# Patient Record
Sex: Female | Born: 1935 | Race: White | Hispanic: No | Marital: Single
Health system: Southern US, Community
[De-identification: ages and names within clinical notes are randomized; demographics above are authoritative.]

## PROBLEM LIST (undated history)

## (undated) DIAGNOSIS — R011 Cardiac murmur, unspecified: Secondary | ICD-10-CM

## (undated) DIAGNOSIS — E079 Disorder of thyroid, unspecified: Secondary | ICD-10-CM

## (undated) DIAGNOSIS — I509 Heart failure, unspecified: Secondary | ICD-10-CM

## (undated) DIAGNOSIS — I10 Essential (primary) hypertension: Secondary | ICD-10-CM

---

## 2009-01-03 ENCOUNTER — Ambulatory Visit: Payer: Self-pay | Admitting: Family Medicine

## 2011-01-16 ENCOUNTER — Ambulatory Visit: Payer: Self-pay | Admitting: Ophthalmology

## 2011-01-29 ENCOUNTER — Ambulatory Visit: Payer: Self-pay | Admitting: Ophthalmology

## 2011-12-04 ENCOUNTER — Inpatient Hospital Stay: Payer: Self-pay | Admitting: Specialist

## 2011-12-04 LAB — CBC
HCT: 46.5 % (ref 35.0–47.0)
MCH: 27.8 pg (ref 26.0–34.0)
Platelet: 386 10*3/uL (ref 150–440)
RDW: 15.6 % — ABNORMAL HIGH (ref 11.5–14.5)

## 2011-12-04 LAB — URINALYSIS, COMPLETE
Bilirubin,UR: NEGATIVE
Blood: NEGATIVE
Glucose,UR: NEGATIVE mg/dL (ref 0–75)
Ketone: NEGATIVE
Leukocyte Esterase: NEGATIVE
Ph: 6 (ref 4.5–8.0)
RBC,UR: <1 /HPF (ref 0–5)
Squamous Epithelial: NONE SEEN

## 2011-12-04 LAB — COMPREHENSIVE METABOLIC PANEL
Anion Gap: 15 (ref 7–16)
Bilirubin,Total: 0.4 mg/dL (ref 0.2–1.0)
Chloride: 102 mmol/L (ref 98–107)
Co2: 23 mmol/L (ref 21–32)
Creatinine: 1.02 mg/dL (ref 0.60–1.30)
EGFR (African American): >60
EGFR (Non-African Amer.): 54 — ABNORMAL LOW
Potassium: 3.8 mmol/L (ref 3.5–5.1)
SGOT(AST): 27 U/L (ref 15–37)
SGPT (ALT): 20 U/L
Sodium: 140 mmol/L (ref 136–145)
Total Protein: 7.4 g/dL (ref 6.4–8.2)

## 2011-12-04 LAB — PROTIME-INR: INR: 0.9

## 2011-12-04 LAB — CK TOTAL AND CKMB (NOT AT ARMC): CK, Total: 40 U/L (ref 21–215)

## 2011-12-04 LAB — TROPONIN I: Troponin-I: <0.02 ng/mL

## 2011-12-04 LAB — TSH: Thyroid Stimulating Horm: 1.61 u[IU]/mL

## 2011-12-04 LAB — HEMOGLOBIN A1C: Hemoglobin A1C: 7.6 % — ABNORMAL HIGH (ref 4.2–6.3)

## 2011-12-06 LAB — HEMATOCRIT: HCT: 35.4 % (ref 35.0–47.0)

## 2012-05-24 ENCOUNTER — Emergency Department (HOSPITAL_COMMUNITY): Payer: Medicare Other

## 2012-05-24 ENCOUNTER — Encounter (HOSPITAL_COMMUNITY): Payer: Self-pay | Admitting: *Deleted

## 2012-05-24 ENCOUNTER — Inpatient Hospital Stay (HOSPITAL_COMMUNITY)
Admission: EM | Admit: 2012-05-24 | Discharge: 2012-05-26 | DRG: 065 | Disposition: A | Discharge status: Home / Self Care | Payer: Medicare Other | Attending: Internal Medicine | Admitting: Internal Medicine

## 2012-05-24 DIAGNOSIS — D72829 Elevated white blood cell count, unspecified: Secondary | ICD-10-CM | POA: Diagnosis present

## 2012-05-24 DIAGNOSIS — G8190 Hemiplegia, unspecified affecting unspecified side: Secondary | ICD-10-CM | POA: Diagnosis present

## 2012-05-24 DIAGNOSIS — D696 Thrombocytopenia, unspecified: Secondary | ICD-10-CM | POA: Diagnosis present

## 2012-05-24 DIAGNOSIS — R4701 Aphasia: Secondary | ICD-10-CM | POA: Diagnosis present

## 2012-05-24 DIAGNOSIS — E871 Hypo-osmolality and hyponatremia: Secondary | ICD-10-CM | POA: Diagnosis present

## 2012-05-24 DIAGNOSIS — G459 Transient cerebral ischemic attack, unspecified: Secondary | ICD-10-CM | POA: Diagnosis present

## 2012-05-24 DIAGNOSIS — E039 Hypothyroidism, unspecified: Secondary | ICD-10-CM | POA: Diagnosis present

## 2012-05-24 DIAGNOSIS — Z79899 Other long term (current) drug therapy: Secondary | ICD-10-CM

## 2012-05-24 DIAGNOSIS — I471 Supraventricular tachycardia, unspecified: Secondary | ICD-10-CM | POA: Diagnosis present

## 2012-05-24 DIAGNOSIS — D649 Anemia, unspecified: Secondary | ICD-10-CM | POA: Diagnosis present

## 2012-05-24 DIAGNOSIS — I6330 Cerebral infarction due to thrombosis of unspecified cerebral artery: Principal | ICD-10-CM | POA: Diagnosis present

## 2012-05-24 DIAGNOSIS — E119 Type 2 diabetes mellitus without complications: Secondary | ICD-10-CM | POA: Diagnosis present

## 2012-05-24 DIAGNOSIS — R29810 Facial weakness: Secondary | ICD-10-CM | POA: Diagnosis present

## 2012-05-24 DIAGNOSIS — I10 Essential (primary) hypertension: Secondary | ICD-10-CM | POA: Diagnosis present

## 2012-05-24 DIAGNOSIS — I639 Cerebral infarction, unspecified: Secondary | ICD-10-CM | POA: Diagnosis present

## 2012-05-24 DIAGNOSIS — H9190 Unspecified hearing loss, unspecified ear: Secondary | ICD-10-CM | POA: Diagnosis present

## 2012-05-24 HISTORY — DX: Disorder of thyroid, unspecified: E07.9

## 2012-05-24 HISTORY — DX: Heart failure, unspecified: I50.9

## 2012-05-24 HISTORY — DX: Cardiac murmur, unspecified: R01.1

## 2012-05-24 HISTORY — DX: Essential (primary) hypertension: I10

## 2012-05-24 LAB — CBC
Hemoglobin: 13 g/dL (ref 12.0–15.0)
MCH: 25.1 pg — ABNORMAL LOW (ref 26.0–34.0)
MCV: 76.6 fL — ABNORMAL LOW (ref 78.0–100.0)
RBC: 5.17 MIL/uL — ABNORMAL HIGH (ref 3.87–5.11)
WBC: 17.4 10*3/uL — ABNORMAL HIGH (ref 4.0–10.5)

## 2012-05-24 LAB — DIFFERENTIAL
Eosinophils Absolute: 0.5 10*3/uL (ref 0.0–0.7)
Eosinophils Relative: 3 % (ref 0–5)
Lymphocytes Relative: 14 % (ref 12–46)
Lymphs Abs: 2.4 10*3/uL (ref 0.7–4.0)
Monocytes Relative: 11 % (ref 3–12)
Neutrophils Relative %: 71 % (ref 43–77)

## 2012-05-24 LAB — COMPREHENSIVE METABOLIC PANEL
ALT: 16 U/L (ref 0–35)
Alkaline Phosphatase: 151 U/L — ABNORMAL HIGH (ref 39–117)
BUN: 24 mg/dL — ABNORMAL HIGH (ref 6–23)
CO2: 22 mEq/L (ref 19–32)
GFR calc Af Amer: 52 mL/min — ABNORMAL LOW (ref >90)
GFR calc non Af Amer: 45 mL/min — ABNORMAL LOW (ref >90)
Glucose, Bld: 192 mg/dL — ABNORMAL HIGH (ref 70–99)
Potassium: 4 mEq/L (ref 3.5–5.1)
Sodium: 132 mEq/L — ABNORMAL LOW (ref 135–145)
Total Protein: 6.7 g/dL (ref 6.0–8.3)

## 2012-05-24 LAB — TROPONIN I: Troponin I: <0.3 ng/mL (ref <0.30)

## 2012-05-24 LAB — PROTIME-INR
INR: 1 (ref 0.00–1.49)
Prothrombin Time: 13.1 seconds (ref 11.6–15.2)

## 2012-05-24 LAB — APTT: aPTT: 33 seconds (ref 24–37)

## 2012-05-24 NOTE — ED Notes (Signed)
Patient walked the restroom this evening around 18:40 and sister notice that patient had difficulty speaking at 18:45. Other family members reported patient had right sided weakness during event.

## 2012-05-24 NOTE — Consult Note (Signed)
Reason for Consult: Concern for stroke Referring Physician: Donnetta Hutching  CC: Right-sided weakness and aphasia  History is obtained from: Patient, son  HPI: Tina Munoz is an 78 y.o. female who was in her normal state of health, when out to dinner and got background 6 or 6:15, then around 6:40 was noted to have significant trouble with her right arm and difficulty speaking. EMS was called and brought her into the emergency room as a code stroke. On arrival, her son and daughter-in-law are able to state that she seemed significantly better than when they had seen her earlier. However, she did have continued right arm weakness and some difficulty speaking.  Also of note, she has had recent loose stools over the course of the day.  Of note she was just recently diagnosed with diabetes given that she had random blood glucose is greater than 200   ROS: An 11 point ROS was performed and is negative except as noted in the HPI.  Past Medical History  Diagnosis Date  . Hypertension   . Diabetes mellitus   . Thyroid disease     Family History: Unremarkable  Social History: Tob: Denies  Exam: Current vital signs: BP 163/87  Pulse 100  Temp 97.8 F (36.6 C) (Oral)  Resp 18  Ht 5\' 8"  (1.727 m)  Wt 70.761 kg (156 lb)  BMI 23.72 kg/m2  SpO2 99% Vital signs in last 24 hours: Temp:  [97.8 F (36.6 C)] 97.8 F (36.6 C) (09/21 1956) Pulse Rate:  [100] 100  (09/21 1956) Resp:  [18] 18  (09/21 1956) BP: (163)/(87) 163/87 mmHg (09/21 1956) SpO2:  [99 %] 99 % (09/21 2014) Weight:  [70.761 kg (156 lb)] 70.761 kg (156 lb) (09/21 1956)  General: Awake, alert, very hard of hearing CV: Regular rate and rhythm Mental Status: Patient is able to give correct age, gives month as May. She does have mild aphasia, but is able to correctly answer most questions She is able to follow commands correctly including showing 2 fingers on a specific hand Cranial Nerves: II: Visual Fields are full to  hand movement , however she has significant difficulty seeing in certain areas. (Baseline) right than left pupil, both are reactive.  Discs are difficult to visualize. III,IV, VI: Eyes are disconjugate (baseline) V,VII: Facial sensation and movement are symmetric.  VIII: Very hard of hearing X: Uvula elevates symmetrically XI: Shoulder shrug is symmetric. XII: tongue is midline without atrophy or fasciculations.  Motor: Tone is normal. Bulk is normal. 5/5 strength was present in her right leg, left leg, left arm. She has 4/5 strength in her right arm with some mild drift but does not touch the bed Sensory: Sensation is symmetric to light touch and pinprick in the arms and legs. Deep Tendon Reflexes: 2+ and symmetric in the biceps and patellae.  Plantars: Toes are downgoing bilaterally.  Cerebellar: FNF and HKS are intact bilaterally Gait: Did not assess due to the acute nature for evaluation and worsening of symptoms once sitting up.  I have reviewed labs in epic and the results pertinent to this consultation are: Elevated white count CBC remarkable for elevated blood glucose   I have reviewed the images obtained: CT head-multiple age indeterminate infarcts  Impression: 78 year old female with sudden onset right hemiparesis and aphasia which is rapidly improving. She still has significant symptoms however with a NIH of 3, previous strokes of indeterminate age, her age plus diabetes, and rapid improvement I do not feel that TPA  is indicated in her case.  Recommendations: 1. HgbA1c, fasting lipid panel 2. MRI, MRA  of the brain without contrast 3. PT consult, OT consult, Speech consult(she will need nursing swallow eval prior to any by mouth medications or food.) 4. Echocardiogram 5. Carotid dopplers 6. Prophylactic therapy-Antiplatelet med: Aspirin - dose 300 if PR, 325 this oral 7. Risk factor modification 8. Telemetry monitoring 9. Frequent neuro checks 10. Permissive  hypertension  [name], MD [facility] [phone]

## 2012-05-24 NOTE — ED Notes (Signed)
Patient with reported speech difficulty and right sided weakness per family. Patient sister reported that patient walked to the restroom around 18:40 and she notice at 18:45 that patient had difficulty speaking. Other family members reported patient with right sided weakness.

## 2012-05-25 ENCOUNTER — Inpatient Hospital Stay (HOSPITAL_COMMUNITY): Payer: Medicare Other

## 2012-05-25 ENCOUNTER — Encounter (HOSPITAL_COMMUNITY): Payer: Self-pay | Admitting: *Deleted

## 2012-05-25 DIAGNOSIS — I10 Essential (primary) hypertension: Secondary | ICD-10-CM | POA: Diagnosis present

## 2012-05-25 DIAGNOSIS — H9190 Unspecified hearing loss, unspecified ear: Secondary | ICD-10-CM | POA: Diagnosis present

## 2012-05-25 DIAGNOSIS — D72829 Elevated white blood cell count, unspecified: Secondary | ICD-10-CM | POA: Diagnosis present

## 2012-05-25 DIAGNOSIS — G459 Transient cerebral ischemic attack, unspecified: Secondary | ICD-10-CM | POA: Diagnosis present

## 2012-05-25 DIAGNOSIS — E119 Type 2 diabetes mellitus without complications: Secondary | ICD-10-CM | POA: Diagnosis present

## 2012-05-25 DIAGNOSIS — E039 Hypothyroidism, unspecified: Secondary | ICD-10-CM | POA: Diagnosis present

## 2012-05-25 LAB — CBC
HCT: 37.7 % (ref 36.0–46.0)
HCT: 36.6 % (ref 36.0–46.0)
Hemoglobin: 12.6 g/dL (ref 12.0–15.0)
Hemoglobin: 11.9 g/dL — ABNORMAL LOW (ref 12.0–15.0)
MCH: 25.3 pg — ABNORMAL LOW (ref 26.0–34.0)
MCH: 24.8 pg — ABNORMAL LOW (ref 26.0–34.0)
MCHC: 33.4 g/dL (ref 30.0–36.0)
MCHC: 32.5 g/dL (ref 30.0–36.0)
MCV: 75.6 fL — ABNORMAL LOW (ref 78.0–100.0)
MCV: 76.4 fL — ABNORMAL LOW (ref 78.0–100.0)
Platelets: 697 K/uL — ABNORMAL HIGH (ref 150–400)
Platelets: 642 K/uL — ABNORMAL HIGH (ref 150–400)
RBC: 4.99 MIL/uL (ref 3.87–5.11)
RBC: 4.79 MIL/uL (ref 3.87–5.11)
RDW: 18.8 % — ABNORMAL HIGH (ref 11.5–15.5)
RDW: 19 % — ABNORMAL HIGH (ref 11.5–15.5)
WBC: 16.8 K/uL — ABNORMAL HIGH (ref 4.0–10.5)
WBC: 15.3 K/uL — ABNORMAL HIGH (ref 4.0–10.5)

## 2012-05-25 LAB — GLUCOSE, CAPILLARY
Glucose-Capillary: 137 mg/dL — ABNORMAL HIGH (ref 70–99)
Glucose-Capillary: 163 mg/dL — ABNORMAL HIGH (ref 70–99)
Glucose-Capillary: 114 mg/dL — ABNORMAL HIGH (ref 70–99)

## 2012-05-25 LAB — URINALYSIS, ROUTINE W REFLEX MICROSCOPIC
Bilirubin Urine: NEGATIVE
Hgb urine dipstick: NEGATIVE
Ketones, ur: NEGATIVE mg/dL
Nitrite: NEGATIVE
Protein, ur: NEGATIVE mg/dL
Specific Gravity, Urine: 1.016 (ref 1.005–1.030)
Urobilinogen, UA: 0.2 mg/dL (ref 0.0–1.0)

## 2012-05-25 LAB — LIPID PANEL
Cholesterol: 200 mg/dL (ref 0–200)
HDL: 72 mg/dL (ref >39)
Total CHOL/HDL Ratio: 2.8 RATIO
Triglycerides: 45 mg/dL (ref <150)

## 2012-05-25 LAB — CREATININE, SERUM
Creatinine, Ser: 0.75 mg/dL (ref 0.50–1.10)
GFR calc Af Amer: 83 mL/min — ABNORMAL LOW
GFR calc non Af Amer: 71 mL/min — ABNORMAL LOW

## 2012-05-25 LAB — HEMOGLOBIN A1C
Hgb A1c MFr Bld: 7.9 % — ABNORMAL HIGH (ref <5.7)
Mean Plasma Glucose: 180 mg/dL — ABNORMAL HIGH (ref <117)

## 2012-05-25 LAB — URINE MICROSCOPIC-ADD ON

## 2012-05-25 MED ORDER — VITAMIN C 500 MG PO TABS
1000.0000 mg | ORAL_TABLET | Freq: Every day | ORAL | Status: DC
  Administered 2012-05-25 – 2012-05-26 (×2): 1000 mg via ORAL
  Filled 2012-05-25 (×2): qty 2

## 2012-05-25 MED ORDER — LEVOTHYROXINE SODIUM 112 MCG PO TABS
112.0000 ug | ORAL_TABLET | Freq: Every day | ORAL | Status: DC
  Administered 2012-05-25 – 2012-05-26 (×2): 112 ug via ORAL
  Filled 2012-05-25 (×4): qty 1

## 2012-05-25 MED ORDER — VITAMIN D3 25 MCG (1000 UNIT) PO TABS
5000.0000 [IU] | ORAL_TABLET | Freq: Every morning | ORAL | Status: DC
  Administered 2012-05-25 – 2012-05-26 (×2): 5000 [IU] via ORAL
  Filled 2012-05-25 (×2): qty 5

## 2012-05-25 MED ORDER — SENNOSIDES-DOCUSATE SODIUM 8.6-50 MG PO TABS: 1.0000 | ORAL_TABLET | Freq: Every evening | ORAL | Status: DC | PRN

## 2012-05-25 MED ORDER — HEPARIN SODIUM (PORCINE) 5000 UNIT/ML IJ SOLN
5000.0000 [IU] | Freq: Three times a day (TID) | INTRAMUSCULAR | Status: DC
  Administered 2012-05-25 – 2012-05-26 (×5): 5000 [IU] via SUBCUTANEOUS
  Filled 2012-05-25 (×8): qty 1

## 2012-05-25 MED ORDER — NITROFURANTOIN MACROCRYSTAL 100 MG PO CAPS
100.0000 mg | ORAL_CAPSULE | Freq: Every morning | ORAL | Status: DC
  Administered 2012-05-25 – 2012-05-26 (×2): 100 mg via ORAL
  Filled 2012-05-25 (×2): qty 1

## 2012-05-25 MED ORDER — INSULIN ASPART 100 UNIT/ML ~~LOC~~ SOLN
0.0000 [IU] | Freq: Three times a day (TID) | SUBCUTANEOUS | Status: DC
  Administered 2012-05-25 – 2012-05-26 (×4): 2 [IU] via SUBCUTANEOUS

## 2012-05-25 MED ORDER — ASPIRIN 325 MG PO TABS
325.0000 mg | ORAL_TABLET | Freq: Every day | ORAL | Status: DC
  Administered 2012-05-25 – 2012-05-26 (×2): 325 mg via ORAL
  Filled 2012-05-25 (×2): qty 1

## 2012-05-25 MED ORDER — SODIUM CHLORIDE 0.9 % IJ SOLN: 3.0000 mL | INTRAMUSCULAR | Status: DC | PRN

## 2012-05-25 MED ORDER — SODIUM CHLORIDE 0.9 % IV SOLN: 250.0000 mL | INTRAVENOUS | Status: DC | PRN

## 2012-05-25 MED ORDER — DILTIAZEM HCL ER 120 MG PO CP24
120.0000 mg | ORAL_CAPSULE | Freq: Every morning | ORAL | Status: DC
  Administered 2012-05-25 – 2012-05-26 (×2): 120 mg via ORAL
  Filled 2012-05-25 (×2): qty 1

## 2012-05-25 MED ORDER — SODIUM CHLORIDE 0.9 % IJ SOLN
3.0000 mL | Freq: Two times a day (BID) | INTRAMUSCULAR | Status: DC
  Administered 2012-05-25 – 2012-05-26 (×4): 3 mL via INTRAVENOUS

## 2012-05-25 MED ORDER — ADULT MULTIVITAMIN W/MINERALS CH
1.0000 | ORAL_TABLET | Freq: Every day | ORAL | Status: DC
  Administered 2012-05-25 – 2012-05-26 (×2): 1 via ORAL
  Filled 2012-05-25 (×2): qty 1

## 2012-05-25 MED ORDER — ASPIRIN 300 MG RE SUPP
300.0000 mg | Freq: Every day | RECTAL | Status: DC
  Filled 2012-05-25 (×2): qty 1

## 2012-05-25 NOTE — ED Provider Notes (Signed)
History     CSN: [id number]  Arrival date & time 05/24/12  1940   First MD Initiated Contact with Patient 05/24/12 2001      Chief Complaint  Patient presents with  . Cerebrovascular Accident    (Consider location/radiation/quality/duration/timing/severity/associated sxs/prior treatment) HPI... slurred speech and right-sided weakness approximately 1840 tonight.   Symptoms have improved slightly. No prodromal illnesses. Level V caveat for urgent need for intervention  Past Medical History  Diagnosis Date  . Hypertension   . Diabetes mellitus   . Thyroid disease     No past surgical history on file.  No family history on file.  History  Substance Use Topics  . Smoking status: Not on file  . Smokeless tobacco: Not on file  . Alcohol Use: No    OB History    Grav Para Term Preterm Abortions TAB SAB Ect Mult Living                  Review of Systems  Unable to perform ROS: Other    Allergies  Review of patient's allergies indicates no known allergies.  Home Medications   Current Outpatient Rx  Name Route Sig Dispense Refill  . VITAMIN C 1000 MG PO TABS Oral Take 1,000 mg by mouth daily.    Marland Kitchen VITAMIN D-3 5000 UNITS PO TABS Oral Take 1 tablet by mouth every morning.    Marland Kitchen DILTIAZEM HCL ER 240 MG PO CP24 Oral Take 240 mg by mouth every morning.    Marland Kitchen LEVOTHYROXINE SODIUM 112 MCG PO TABS Oral Take 112 mcg by mouth every morning.    Marland Kitchen LISINOPRIL 10 MG PO TABS Oral Take 10 mg by mouth every morning.    . LUTEIN-ZEAXANTHIN PO Oral Take 1 tablet by mouth daily.    . ADULT MULTIVITAMIN W/MINERALS CH Oral Take 1 tablet by mouth daily.    Marland Kitchen NITROFURANTOIN MACROCRYSTAL 100 MG PO CAPS Oral Take 100 mg by mouth every morning.      BP 155/88  Pulse 84  Temp 97.8 F (36.6 C) (Oral)  Resp 13  Ht 5\' 8"  (1.727 m)  Wt 156 lb (70.761 kg)  BMI 23.72 kg/m2  SpO2 98%  Physical Exam  Nursing note and vitals reviewed. Constitutional: She is oriented to person, place, and  time. She appears well-developed and well-nourished.       Hard of hearing  HENT:  Head: Normocephalic and atraumatic.  Eyes: Conjunctivae normal and EOM are normal. Pupils are equal, round, and reactive to light.  Neck: Normal range of motion. Neck supple.  Cardiovascular: Normal rate, regular rhythm and normal heart sounds.   Pulmonary/Chest: Effort normal and breath sounds normal.  Abdominal: Soft. Bowel sounds are normal.  Musculoskeletal: Normal range of motion.  Neurological: She is alert and oriented to person, place, and time.  Skin: Skin is warm and dry.  Psychiatric: She has a normal mood and affect.    ED Course  Procedures (including critical care time)  Labs Reviewed  CBC - Abnormal; Notable for the following:    WBC 17.4 (*)     RBC 5.17 (*)     MCV 76.6 (*)     MCH 25.1 (*)     RDW 18.9 (*)     Platelets 704 (*)     All other components within normal limits  DIFFERENTIAL - Abnormal; Notable for the following:    Neutro Abs 12.3 (*)     Monocytes Absolute 1.9 (*)  Basophils Absolute 0.2 (*)     All other components within normal limits  COMPREHENSIVE METABOLIC PANEL - Abnormal; Notable for the following:    Sodium 132 (*)     Glucose, Bld 192 (*)     BUN 24 (*)     Albumin 3.2 (*)     Alkaline Phosphatase 151 (*)     Total Bilirubin 0.2 (*)     GFR calc non Af Amer 45 (*)     GFR calc Af Amer 52 (*)     All other components within normal limits  PROTIME-INR  APTT  TROPONIN I  URINALYSIS, ROUTINE W REFLEX MICROSCOPIC   Ct Head Wo Contrast  05/24/2012  *RADIOLOGY REPORT*  Clinical Data: Code stroke.  Transient right arm weakness and possible slurred speech.  CT HEAD WITHOUT CONTRAST  Technique:  Contiguous axial images were obtained from the base of the skull through the vertex without contrast.  Comparison: None.  Findings: No intracranial hemorrhage.  Remote infarct with encephalomalacia left parietal lobe and right parietal - occipital region.   Moderate small vessel disease type changes.  Right frontal lobe small infarct, age indeterminate.  No CT evidence of large acute infarct.  No intracranial mass lesion detected on this unenhanced exam.  Global atrophy without hydrocephalus.  Partially empty sella.  Mastoid air cells, middle ear cavities and visualized sinuses are clear.  IMPRESSION: No intracranial hemorrhage or CT evidence of large acute infarct.  Remote infarcts and small vessel disease type changes as noted above.  Critical Value/emergent results were called by telephone at the time of interpretation on 05/24/2012 at 7:58 p.m. to Dr. Weldon Inches, who verbally acknowledged these results.   Original Report Authenticated By: Fuller Canada, M.D.      No diagnosis found.    MDM  Daughter reports the patient appears to be improving. CT scan of head shows no acute findings. Discussed with hospitalist. Admit.        Donnetta Hutching, MD 05/30/12 [phone]

## 2012-05-25 NOTE — Progress Notes (Signed)
[facility] PROGRESS NOTE  Tina Munoz WGN:[id number] DOB: 27-Jan-1918 DOA: 05/24/2012 PCP: Lorie Phenix, MD  Assessment/Plan: Principal Problem:  *TIA (transient ischemic attack) Active Problems:  HTN (hypertension)  Hearing loss  Leukocytosis  1. Possible CVA with right hemiparesis and aphasia versus TIA: Aphasia has resolved. Patient has minimal right fingers dexterity deficit. Followup on MRI brain and rest of the stroke workup. Neurology consultation appreciated in following. Continue aspirin 325 mg daily for secondary prophylaxis. Consider statins. PT, OT and ST evaluation. 2. Hypertension: Allow permissive hypertension. Continue to hold lisinopril but will start diltiazem XL at reduced dose of 120 mg daily given NSSVT and risk for rebound tachycardia. 3. Type 2 diabetes mellitus: Diet controlled. Hemoglobin A1c 7.9. Reasonable inpatient control. Check CBGs and place on sliding scale insulin. May need low dose oral hypoglycemics as outpatient. 4. Hypothyroidism: Continue Synthroid. TSH 5.833. May consider increasing Synthroid as outpatient. 5. Hyponatremia: Follow BMP in a.m. 6. Leukocytosis: No clinical focus of infection.? Stress response. Improving. Monitor. 7. Mild anemia and thrombocytosis. Follow CBC in a.m.  Code Status: DO NOT RESUSCITATE. Family Communication: Discussed with patient's daughter Ms. Christella Scheuermann, son Mr. Katrine Radich and daughter-in-law at the bedside, updated care and answered questions. Disposition Plan: Home   Brief narrative: 78 y.o. female who was in her normal state of health, when out to dinner and got background 6 or 6:15, then around 6:40 p.m. on 05/24/12 was noted to have significant trouble with her right arm and difficulty speaking. EMS was called and brought her into the emergency room as a code stroke. On arrival, her son and daughter-in-law are able to state that she seemed significantly better than when they had seen her earlier.  However, she did have continued right arm weakness and some difficulty speaking. She was not a TPA candidate secondary to rapid improvement and advanced age.   Consultants:  Neurology.  Procedures:  None  Antibiotics:  None  HPI/Subjective: Patient denies complaints and wishes to go home. Per family, speech is back to baseline. She still has difficulty in her right upper extremity and was not able to hold the spoon to feed herself  Objective: Filed Vitals:   05/25/12 0620 05/25/12 0815 05/25/12 0953 05/25/12 1159  BP: 146/89 163/84 169/83 170/97  Pulse: 92 86 80 77  Temp: 98 F (36.7 C) 97.9 F (36.6 C) 97.8 F (36.6 C) 97.9 F (36.6 C)  TempSrc: Oral     Resp: 18 20 20 20   Height:      Weight:      SpO2: 100% 98% 99% 98%   No intake or output data in the 24 hours ending 05/25/12 1358 Filed Weights   05/24/12 1956 05/25/12 0309  Weight: 70.761 kg (156 lb) 63.957 kg (141 lb)    Exam:   General exam: Moderately built and nourished elderly female patient, sitting comfortably on the chair and in no obvious distress. Hard of hearing.  Respiratory system: Clear to auscultation. No increased work of breathing.  Cardiovascular system: S1 and S2 heard, regular rate and rhythm. No JVD, murmurs or gallops or pedal edema. Telemetry shows sinus rhythm in the 70s to 80s and occasional runs of nonsustained SVT.  Gastrointestinal system: Nondistended, soft and nontender. Normal bowel sounds heard.  Central nervous system: Alert and oriented. Hard of hearing. No cranial nerve deficits.  Extremities: Grade 5 x 5 power in all limbs.   Data Reviewed: Basic Metabolic Panel:  Lab 05/25/12 7846 05/24/12 1947  NA -- 132*  K -- 4.0  CL -- 98  CO2 -- 22  GLUCOSE -- 192*  BUN -- 24*  CREATININE 0.75 1.04  CALCIUM -- 9.6  MG -- --  PHOS -- --   Liver Function Tests:  Lab 05/24/12 1947  AST 19  ALT 16  ALKPHOS 151*  BILITOT 0.2*  PROT 6.7  ALBUMIN 3.2*   No results  found for this basename: LIPASE:5,AMYLASE:5 in the last 168 hours No results found for this basename: AMMONIA:5 in the last 168 hours CBC:  Lab 05/25/12 0540 05/25/12 0059 05/24/12 1947  WBC 15.3* 16.8* 17.4*  NEUTROABS -- -- 12.3*  HGB 11.9* 12.6 13.0  HCT 36.6 37.7 39.6  MCV 76.4* 75.6* 76.6*  PLT 642* 697* 704*   Cardiac Enzymes:  Lab 05/24/12 1947  CKTOTAL --  CKMB --  CKMBINDEX --  TROPONINI <0.30   BNP (last 3 results) No results found for this basename: PROBNP:3 in the last 8760 hours CBG:  Lab 05/25/12 1217 05/25/12 0641 05/25/12 0321  GLUCAP 164* 163* 137*    No results found for this or any previous visit (from the past 240 hour(s)).   Studies: Ct Head Wo Contrast  05/24/2012  *RADIOLOGY REPORT*  Clinical Data: Code stroke.  Transient right arm weakness and possible slurred speech.  CT HEAD WITHOUT CONTRAST  Technique:  Contiguous axial images were obtained from the base of the skull through the vertex without contrast.  Comparison: None.  Findings: No intracranial hemorrhage.  Remote infarct with encephalomalacia left parietal lobe and right parietal - occipital region.  Moderate small vessel disease type changes.  Right frontal lobe small infarct, age indeterminate.  No CT evidence of large acute infarct.  No intracranial mass lesion detected on this unenhanced exam.  Global atrophy without hydrocephalus.  Partially empty sella.  Mastoid air cells, middle ear cavities and visualized sinuses are clear.  IMPRESSION: No intracranial hemorrhage or CT evidence of large acute infarct.  Remote infarcts and small vessel disease type changes as noted above.  Critical Value/emergent results were called by telephone at the time of interpretation on 05/24/2012 at 7:58 p.m. to Dr. Weldon Inches, who verbally acknowledged these results.   Original Report Authenticated By: Fuller Canada, M.D.     Scheduled Meds:    . aspirin  300 mg Rectal Daily   Or  . aspirin  325 mg Oral Daily    . cholecalciferol  5,000 Units Oral q morning - 10a  . heparin  5,000 Units Subcutaneous Q8H  . insulin aspart  0-9 Units Subcutaneous TID WC  . levothyroxine  112 mcg Oral Q breakfast  . multivitamin with minerals  1 tablet Oral Daily  . nitrofurantoin  100 mg Oral q morning - 10a  . sodium chloride  3 mL Intravenous Q12H  . vitamin C  1,000 mg Oral Daily     [facility]  [facility] Pager [phone].. If 8PM-8AM, please contact night-coverage at [email or web address], password [email or web address] 05/25/2012, 1:58 PM  LOS: 1 day

## 2012-05-25 NOTE — Plan of Care (Signed)
Problem: Phase I Progression Outcomes Goal: Strict NPO til swallow screen done Outcome: Completed/Met Date Met:  05/25/12 Patient NPO until passing swallowing study. Goal: Antithrombotic given by end of Day 2 Outcome: Completed/Met Date Met:  05/25/12 Patient receiving heparin injection for vte precautions. Goal: Pain controlled with appropriate interventions Outcome: Progressing Patient denying pain at this time. Goal: Voiding-avoid urinary catheter unless indicated Outcome: Completed/Met Date Met:  05/25/12 Patient able to verbalize when needing to defecate or urinate.

## 2012-05-25 NOTE — Progress Notes (Signed)
VASCULAR LAB PRELIMINARY  PRELIMINARY  PRELIMINARY  PRELIMINARY  Carotid Dopplers completed.    Preliminary report:  There is no ICA stenosis.  Vertebral artery flow is antegrade.  Tina Munoz, 05/25/2012, 4:45 PM

## 2012-05-25 NOTE — H&P (Signed)
[facility] History and Physical  Cheri Ayotte ZOX:[id number] DOB: 1918/02/28 DOA: 05/24/2012  Referring physician: ED PCP: Lorie Phenix, MD   Chief Complaint: TIA  HPI: Tina Munoz is a 78 y.o. female who was feeling well until 6:40 PM this afternoon when she was noted to have slurred speech, R sided facial droop with drooling, RUE weakness, by her daughter in law.  EMS was called and patient was brought to ED as code stroke, on arrival to ED patient noted to have significantly improved symptoms by daughter in law and son, patient ruled out for TPA based on age criteria and improving symptoms.  Thankfully her rapid improvement continued and by the time Hospitalist service is consulted to admit the patient she is essentially back to baseline according to her daughter.  Review of Systems: The patient has severe deafness (baseline) dysconjugate gaze (baseline), poor peripheral vision (baseline), no dysuria, no fever, chills, notes no RUE weakness.  Past Medical History  Diagnosis Date  . Hypertension   . Diabetes mellitus   . Thyroid disease    No past surgical history on file. Social History:  does not have a smoking history on file. She does not have any smokeless tobacco history on file. She reports that she does not drink alcohol or use illicit drugs.  No Known Allergies  No family history on file.  Prior to Admission medications   Medication Sig Start Date End Date Taking? Authorizing Provider  Ascorbic Acid (VITAMIN C) 1000 MG tablet Take 1,000 mg by mouth daily.   Yes Historical Provider, MD  Cholecalciferol (VITAMIN D-3) 5000 UNITS TABS Take 1 tablet by mouth every morning.   Yes Historical Provider, MD  diltiazem (DILACOR XR) 240 MG 24 hr capsule Take 240 mg by mouth every morning.   Yes Historical Provider, MD  levothyroxine (SYNTHROID, LEVOTHROID) 112 MCG tablet Take 112 mcg by mouth every morning.   Yes Historical Provider, MD  lisinopril (PRINIVIL,ZESTRIL) 10 MG  tablet Take 10 mg by mouth every morning.   Yes Historical Provider, MD  LUTEIN-ZEAXANTHIN PO Take 1 tablet by mouth daily.   Yes Historical Provider, MD  Multiple Vitamin (MULTIVITAMIN WITH MINERALS) TABS Take 1 tablet by mouth daily.   Yes Historical Provider, MD  nitrofurantoin (MACRODANTIN) 100 MG capsule Take 100 mg by mouth every morning.   Yes Historical Provider, MD   Physical Exam: Filed Vitals:   05/24/12 2315 05/24/12 2330 05/24/12 2345 05/25/12 0000  BP:    155/88  Pulse: 82 86 84 84  Temp:      TempSrc:      Resp: 10 15 11 13   Height:      Weight:      SpO2: 98% 97% 97% 98%     General:  NAD resting comfortably in hospital bed  Eyes: PEERLA, EOMI, patient has dysconjugate gaze which is baseline, visual fields demonstrate difficulty with peripheral vision but no obvious heminopsia  ENT: Patient has severe hearing loss which is baseline according to her and her daughter  Neck: Supple, w/o JVD, w/o carotid brewit  Cardiovascular: RRR, there is a SEM 3/6 best heard at the R 2nd intercostal space  Respiratory: CTA B  Abdomen: soft, nt, nd, bs+  Skin: no edema, no rash  Musculoskeletal: 5/5 strength BUE, BLE, MAE  Neurologic: Difficulty with peripheral vision but no obvious heminopsia, dysconjugate gaze which is baseline is noted, CN 2-12 otherwise grossly intact, toung protrusion with slight deviation to the R side, mentation appears intact, smile is  symmetric, strength 5/5 in all extremities, coordination is slightly abnormal to depth perception but probably baseline considering the dysconjugate gaze  Labs on Admission:  Basic Metabolic Panel:  Lab 05/24/12 1610  NA 132*  K 4.0  CL 98  CO2 22  GLUCOSE 192*  BUN 24*  CREATININE 1.04  CALCIUM 9.6  MG --  PHOS --   Liver Function Tests:  Lab 05/24/12 1947  AST 19  ALT 16  ALKPHOS 151*  BILITOT 0.2*  PROT 6.7  ALBUMIN 3.2*   No results found for this basename: LIPASE:5,AMYLASE:5 in the last 168  hours No results found for this basename: AMMONIA:5 in the last 168 hours CBC:  Lab 05/24/12 1947  WBC 17.4*  NEUTROABS 12.3*  HGB 13.0  HCT 39.6  MCV 76.6*  PLT 704*   Cardiac Enzymes:  Lab 05/24/12 1947  CKTOTAL --  CKMB --  CKMBINDEX --  TROPONINI <0.30    BNP (last 3 results) No results found for this basename: PROBNP:3 in the last 8760 hours CBG: No results found for this basename: GLUCAP:5 in the last 168 hours  Radiological Exams on Admission: Ct Head Wo Contrast  05/24/2012  *RADIOLOGY REPORT*  Clinical Data: Code stroke.  Transient right arm weakness and possible slurred speech.  CT HEAD WITHOUT CONTRAST  Technique:  Contiguous axial images were obtained from the base of the skull through the vertex without contrast.  Comparison: None.  Findings: No intracranial hemorrhage.  Remote infarct with encephalomalacia left parietal lobe and right parietal - occipital region.  Moderate small vessel disease type changes.  Right frontal lobe small infarct, age indeterminate.  No CT evidence of large acute infarct.  No intracranial mass lesion detected on this unenhanced exam.  Global atrophy without hydrocephalus.  Partially empty sella.  Mastoid air cells, middle ear cavities and visualized sinuses are clear.  IMPRESSION: No intracranial hemorrhage or CT evidence of large acute infarct.  Remote infarcts and small vessel disease type changes as noted above.  Critical Value/emergent results were called by telephone at the time of interpretation on 05/24/2012 at 7:58 p.m. to Dr. Weldon Inches, who verbally acknowledged these results.   Original Report Authenticated By: Fuller Canada, M.D.     EKG: Independently reviewed.  Assessment/Plan Principal Problem:  *TIA (transient ischemic attack) Active Problems:  HTN (hypertension)  Hearing loss  Leukocytosis   1. TIA - will admit patient, carotid dopplers ordered, 2d echo ordered, MRI/MRA ordered, Tele monitor ordered, not really able  to appreciate a residual deficit at this time other than possibly mild R sided toung deviation. 2. HTN - allow permissive HTN due to TIA hold home HTN meds 3. Hearing loss - chronic and baseline 4. Leukocytosis and thrombocytosis - probably reactive from #1, patient has no signs or symptoms of infection, will monitor with AM labs. 5. Hypothyroidism - ordering TSH to check up on this.  Code Status: Full Code for now Family Communication: Spoke with daughter  Disposition Plan: Admit to inpatient  Time spent: 70 min  Mykhia Danish M. [facility] Pager [phone]  If 7PM-7AM, please contact night-coverage [email or web address] Password [facility] 05/25/2012, 12:57 AM

## 2012-05-25 NOTE — Progress Notes (Signed)
History: Tina Munoz is an 78 y.o. female who was in her normal state of health, when out to dinner and got background 6 or 6:15, then around 6:40 was noted to have significant trouble with her right arm and difficulty speaking. EMS was called and brought her into the emergency room as a code stroke. On arrival, her son and daughter-in-law are able to state that she seemed significantly better than when they had seen her earlier. However, she did have continued right arm weakness and some difficulty speaking.  LSN: 6:00 pm 05/24/12 tPA Given: no,NIH of 3, previous strokes of indeterminate age, her age plus diabetes, and rapid improvement  did not feel that TPA was indicated in her case.  Subjective: obtained by Dr. Pearlean Brownie: Doing well. No c/o.  Objective: BP 169/83  Pulse 80  Temp 97.8 F (36.6 C) (Oral)  Resp 20  Ht 5\' 8"  (1.727 m)  Wt 63.957 kg (141 lb)  BMI 21.44 kg/m2  SpO2 99%  CBGs  Basename 05/25/12 0641 05/25/12 0321  GLUCAP 163* 137*    Diet: cho mod  Activity:up   DVT Prophylaxis: heparin   Medications: Scheduled:   . aspirin  300 mg Rectal Daily   Or  . aspirin  325 mg Oral Daily  . cholecalciferol  5,000 Units Oral q morning - 10a  . heparin  5,000 Units Subcutaneous Q8H  . insulin aspart  0-9 Units Subcutaneous TID WC  . levothyroxine  112 mcg Oral Q breakfast  . multivitamin with minerals  1 tablet Oral Daily  . nitrofurantoin  100 mg Oral q morning - 10a  . sodium chloride  3 mL Intravenous Q12H  . vitamin C  1,000 mg Oral Daily   Neurologic Exam: performed by Dr. Pearlean Brownie. Mental Status: Alert, oriented, thought content appropriate.  Speech fluent without evidence of aphasia. Able to follow 3 step commands without difficulty. Cranial Nerves: II- Visual fields grossly intact. III/IV/VI-Extraocular movements intact.  Pupils reactive bilaterally. V/VII-Smile symmetric VIII-hearing grossly intact IX/X-normal gag XI-bilateral shoulder shrug XII-midline  tongue extension Motor: 5/5 bilaterally with normal tone and bulk.diminished fine finger movements on right. Orbits left over right upper extremity Sensory: Light touch intact throughout, bilaterally Deep Tendon Reflexes: 2+ and symmetric throughout Plantars: Downgoing bilaterally Cerebellar: Impaired fine finger movements on the right.  Lab Results: Basic Metabolic Panel:  Lab 05/25/12 4696 05/24/12 1947  NA -- 132*  K -- 4.0  CL -- 98  CO2 -- 22  GLUCOSE -- 192*  BUN -- 24*  CREATININE 0.75 1.04  CALCIUM -- 9.6  MG -- --  PHOS -- --   Liver Function Tests:  Lab 05/24/12 1947  AST 19  ALT 16  ALKPHOS 151*  BILITOT 0.2*  PROT 6.7  ALBUMIN 3.2*   CBC:  Lab 05/25/12 0540 05/25/12 0059 05/24/12 1947  WBC 15.3* 16.8* --  NEUTROABS -- -- 12.3*  HGB 11.9* 12.6 --  HCT 36.6 37.7 --  MCV 76.4* 75.6* --  PLT 642* 697* --   CBG:  Lab 05/25/12 0641 05/25/12 0321  GLUCAP 163* 137*   Fasting Lipid Panel:  Lab 05/25/12 0540  CHOL 200  HDL 72  LDLCALC 119*  TRIG 45  CHOLHDL 2.8  LDLDIRECT --   Coagulation:  Lab 05/24/12 1947  LABPROT 13.1  INR 1.00    Lab 05/25/12 0012  COLORURINE YELLOW  LABSPEC 1.016  PHURINE 6.5  GLUCOSEU NEGATIVE  HGBUR NEGATIVE  BILIRUBINUR NEGATIVE  KETONESUR NEGATIVE  PROTEINUR NEGATIVE  UROBILINOGEN 0.2  NITRITE NEGATIVE  LEUKOCYTESUR SMALL*   Misc. Labs  Study Results:  05/24/2012    CT HEAD WITHOUT CONTRAST  Technique:  Contiguous axial images were obtained from the base of the skull through the vertex without contrast.  Comparison: None.  Findings: No intracranial hemorrhage.  Remote infarct with encephalomalacia left parietal lobe and right parietal - occipital region.  Moderate small vessel disease type changes.  Right frontal lobe small infarct, age indeterminate.  No CT evidence of large acute infarct.  No intracranial mass lesion detected on this unenhanced exam.  Global atrophy without hydrocephalus.  Partially empty  sella.  Mastoid air cells, middle ear cavities and visualized sinuses are clear.  IMPRESSION: No intracranial hemorrhage or CT evidence of large acute infarct.  Remote infarcts and small vessel disease type changes as noted above. : Fuller Canada, M.D.    Therapies:pending   Assessment: 78 year old female with sudden onset right hemiparesis and aphasia which has nearly resolved; minimal right had fine finger deficits.     LDL 119; not at goal <70 A1C pending  Plan: 1. HgbA1c 2. MRI, MRA of the brain without contrast  3. PT consult, OT consult, Speech consult(she will need nursing swallow eval prior to any by mouth medications or food.)  4. Echocardiogram  5. Carotid dopplers  6. Prophylactic therapy-Antiplatelet med: Aspirin - dose 300 if PR, 325 this oral  7. Risk factor modification  8. Telemetry monitoring  9. Frequent neuro checks  10. Permissive hypertension   LOS: 1 day   Marya Fossa PA-C [facility] [phone] 05/25/2012  11:03 AM

## 2012-05-25 NOTE — Progress Notes (Signed)
  Echocardiogram 2D Echocardiogram has been performed.  Tina Munoz 05/25/2012, 11:26 AM

## 2012-05-25 NOTE — Plan of Care (Signed)
Problem: Phase I Progression Outcomes Goal: Pt admitted to Stroke Unit Outcome: Completed/Met Date Met:  05/25/12 Patient admitted to stroke unit on 05/24/12.

## 2012-05-25 NOTE — Progress Notes (Signed)
PT Cancellation Note  Treatment cancelled today due to medical issues with patient which prohibited therapy.  Chart reviewed noting order to begin PT evaluation on 9/23.  If entered in error please correct.  Will check back 9/22 pm and proceed or hold as indicated in documentation.  Thank you.  Narda Amber [facility] 05/25/2012, 8:34 AM

## 2012-05-26 DIAGNOSIS — E039 Hypothyroidism, unspecified: Secondary | ICD-10-CM

## 2012-05-26 DIAGNOSIS — I6350 Cerebral infarction due to unspecified occlusion or stenosis of unspecified cerebral artery: Secondary | ICD-10-CM

## 2012-05-26 DIAGNOSIS — I10 Essential (primary) hypertension: Secondary | ICD-10-CM

## 2012-05-26 DIAGNOSIS — E119 Type 2 diabetes mellitus without complications: Secondary | ICD-10-CM

## 2012-05-26 LAB — GLUCOSE, CAPILLARY
Glucose-Capillary: 162 mg/dL — ABNORMAL HIGH (ref 70–99)
Glucose-Capillary: 119 mg/dL — ABNORMAL HIGH (ref 70–99)

## 2012-05-26 LAB — CBC
Hemoglobin: 12 g/dL (ref 12.0–15.0)
MCHC: 33 g/dL (ref 30.0–36.0)
RDW: 19.2 % — ABNORMAL HIGH (ref 11.5–15.5)
WBC: 15.2 10*3/uL — ABNORMAL HIGH (ref 4.0–10.5)

## 2012-05-26 LAB — BASIC METABOLIC PANEL
Chloride: 103 mEq/L (ref 96–112)
GFR calc Af Amer: 83 mL/min — ABNORMAL LOW (ref >90)
GFR calc non Af Amer: 72 mL/min — ABNORMAL LOW (ref >90)
Potassium: 3.8 mEq/L (ref 3.5–5.1)
Sodium: 136 mEq/L (ref 135–145)

## 2012-05-26 MED ORDER — BOOST PLUS PO LIQD
237.0000 mL | Freq: Two times a day (BID) | ORAL | Status: DC
Start: 2012-05-26 — End: 2012-05-26
  Filled 2012-05-26 (×3): qty 237

## 2012-05-26 MED ORDER — GLIMEPIRIDE 1 MG PO TABS: 0.5000 mg | ORAL_TABLET | Freq: Every day | ORAL | Status: AC

## 2012-05-26 MED ORDER — SIMVASTATIN 10 MG PO TABS: 10.0000 mg | ORAL_TABLET | Freq: Every day | ORAL | Status: AC

## 2012-05-26 MED ORDER — ASPIRIN EC 325 MG PO TBEC: 325.0000 mg | DELAYED_RELEASE_TABLET | Freq: Every day | ORAL | Status: AC

## 2012-05-26 NOTE — Care Management Note (Signed)
    Page 1 of 1   05/27/2012     1:43:49 PM   CARE MANAGEMENT NOTE 05/27/2012  Patient:  Tina Munoz, Tina Munoz   Account Number:  [id number]  Date Initiated:  05/26/2012  Documentation initiated by:  Onnie Boer  Subjective/Objective Assessment:   PT WAS ADMITTED WITH APHASIA     Action/Plan:   PROGRESSION OF CARE AND DISCHARGE PLANNING   Anticipated DC Date:  05/28/2012   Anticipated DC Plan:  HOME W HOME HEALTH SERVICES      DC Planning Services  CM consult      Choice offered to / List presented to:  C-1 Patient        HH arranged  HH-5 SPEECH THERAPY      HH agency  [facility]   Status of service:  Completed, signed off Medicare Important Message given?   (If response is "NO", the following Medicare IM given date fields will be blank) Date Medicare IM given:   Date Additional Medicare IM given:    Discharge Disposition:  HOME W HOME HEALTH SERVICES  Per UR Regulation:  Reviewed for med. necessity/level of care/duration of stay  If discussed at Long Length of Stay Meetings, dates discussed:    Comments:  05/26/12 Onnie Boer, RN, BSN 1139 PT WAS ADMITTED WITH APAHSIA.  PTA PT WAS AT HOME WITH FAMILY.  WILL F/U ON DC NEEDS AND RECOMMENDATIONS

## 2012-05-26 NOTE — Evaluation (Signed)
Physical Therapy Evaluation and Discharge.  Patient Details Name: Tina Munoz MRN: [id number] DOB: [birth year] Today's Date: 05/26/2012 Time: 7829-5621 PT Time Calculation (min): 37 min  PT Assessment / Plan / Recommendation Clinical Impression  Pt is a 78 y/o female admitted for CVA.  Pt presents with no residual symptoms from CVA.  Pt demonstrates mobility and cognition at her baseline level.  Spoke with pt and family at length about discharge environment.  No change in living environment or amount of supervison required at this time.  Pt should be safe to return to home with daughter and personal care assistant.  No further acute or follow-up PT needs at this time.      PT Assessment  Patent does not need any further PT services    Follow Up Recommendations  No PT follow up;Supervision - Intermittent    Barriers to Discharge        Equipment Recommendations  None recommended by PT    Recommendations for Other Services     Frequency      Precautions / Restrictions Precautions Precautions: Fall Restrictions Weight Bearing Restrictions: No   Pertinent Vitals/Pain No c/o pain      Mobility  Bed Mobility Bed Mobility: Supine to Sit;Sit to Supine;Sitting - Scoot to Edge of Bed Supine to Sit: 7: Independent;HOB flat Sitting - Scoot to Edge of Bed: 7: Independent Sit to Supine: 7: Independent;HOB flat Transfers Transfers: Sit to Stand;Stand to Sit Sit to Stand: 5: Supervision;From bed;With upper extremity assist;6: Modified independent (Device/Increase time) Stand to Sit: 5: Supervision;6: Modified independent (Device/Increase time);To bed;With upper extremity assist Details for Transfer Assistance: Supervision for safety. Verbal cues to place hands on bed prior to sitting, pt was aware that she had done it wrong first trial and able to perform task properly second trial.  Ambulation/Gait Ambulation/Gait Assistance: 6: Modified independent (Device/Increase time) Ambulation  Distance (Feet): 150 Feet Assistive device: Rolling walker Ambulation/Gait Assistance Details: supervision for safety, no assist or instruction required.  Gait Pattern: Within Functional Limits Gait velocity: WFL Stairs: No Wheelchair Mobility Wheelchair Mobility: No Modified Rankin (Stroke Patients Only) Pre-Morbid Rankin Score: No significant disability Modified Rankin: No significant disability    Exercises     PT Diagnosis:    PT Problem List:   PT Treatment Interventions:     PT Goals    Visit Information  Last PT Received On: 05/26/12 Assistance Needed: +1    Subjective Data  Subjective: agreeable to PT eval Patient Stated Goal: Return to home with daughter   Prior Functioning  Home Living Lives With: Daughter Available Help at Discharge: Family Type of Home: House Home Access: Ramped entrance Home Layout: One level Bathroom Shower/Tub: Walk-in shower;Door [employer] Toilet: Handicapped height Bathroom Accessibility: Yes How Accessible: Accessible via wheelchair;Accessible via walker Home Adaptive Equipment: Bedside commode/3-in-1;Built-in shower seat;Grab bars around toilet;Grab bars in shower;Walker - rolling;Walker - four wheeled;Hand-held shower hose Additional Comments: pt has life alert and home health aid 4 hours per day.  Pt home alone approximately 4 hours per day.   Prior Function Level of Independence: Independent with assistive device(s) Able to Take Stairs?: No Driving: No Vocation: Unemployed Communication Communication: HOH Dominant Hand: Right    Cognition  Overall Cognitive Status: Appears within functional limits for tasks assessed/performed Arousal/Alertness: Awake/alert Orientation Level: Oriented X4 / Intact Behavior During Session: [facility] for tasks performed    Extremity/Trunk Assessment Right Upper Extremity Assessment RUE ROM/Strength/Tone: Within functional levels RUE Sensation: WFL - Proprioception;WFL - Light Touch RUE  Coordination: WFL - gross/fine motor Left Upper Extremity Assessment LUE ROM/Strength/Tone: Within functional levels LUE Sensation: WFL - Light Touch;WFL - Proprioception LUE Coordination: WFL - gross/fine motor Right Lower Extremity Assessment RLE ROM/Strength/Tone: Within functional levels RLE Sensation: WFL - Proprioception;WFL - Light Touch RLE Coordination: WFL - gross/fine motor Left Lower Extremity Assessment LLE ROM/Strength/Tone: Within functional levels LLE Sensation: WFL - Light Touch;WFL - Proprioception LLE Coordination: WFL - gross motor Trunk Assessment Trunk Assessment: Kyphotic   Balance Balance Balance Assessed: Yes Static Sitting Balance Static Sitting - Balance Support: Feet supported;No upper extremity supported Static Sitting - Level of Assistance: 7: Independent Static Sitting - Comment/# of Minutes: >2 minutes with no LOB Dynamic Standing Balance Dynamic Standing - Balance Support: Bilateral upper extremity supported Dynamic Standing - Level of Assistance: 5: Stand by assistance Dynamic Standing - Balance Activities: Forward lean/weight shifting;Reaching for objects;Lateral lean/weight shifting  End of Session PT - End of Session Equipment Utilized During Treatment: Gait belt Activity Tolerance: Patient tolerated treatment well Patient left: in bed;with call bell/phone within reach;with bed alarm set;with family/visitor present Nurse Communication: Mobility status  GP     Gardner Servantes 05/26/2012, 3:31 PM  Jalan Bodi L. Angelise Petrich DPT [phone]

## 2012-05-26 NOTE — Evaluation (Signed)
SLP has reviewed and agrees with student's note below.  Tina Munoz Shakisha Abend M.Ed CCC-SLP Pager [phone]  05/26/2012  

## 2012-05-26 NOTE — Progress Notes (Signed)
Pt discharge education and teaching complete. Pt stable and ready to go home.

## 2012-05-26 NOTE — Progress Notes (Signed)
History: Tina Munoz is an 78 y.o. female who was in her normal state of health, when out to dinner and got background 6 or 6:15, then around 6:40 was noted to have significant trouble with her right arm and difficulty speaking. EMS was called and brought her into the emergency room as a code stroke. On arrival, her son and daughter-in-law are able to state that she seemed significantly better than when they had seen her earlier. However, she did have continued right arm weakness and some difficulty speaking.  LSN: 6:00 pm 05/24/12 tPA Given: no,NIH of 3, previous strokes of indeterminate age, her age plus diabetes, and rapid improvement  did not feel that TPA was indicated in her case.  Subjective Daughter at bedside. No new complaints.  Objective: BP 147/80  Pulse 73  Temp 98 F (36.7 C) (Oral)  Resp 20  Ht 5\' 8"  (1.727 m)  Wt 63.957 kg (141 lb)  BMI 21.44 kg/m2  SpO2 97%  CBGs  Basename 05/26/12 0642 05/25/12 2144 05/25/12 1659 05/25/12 1217 05/25/12 0641 05/25/12 0321  GLUCAP 121* 114* 174* 164* 163* 137*    Diet: cho mod Activity:up  DVT Prophylaxis: heparin  Medications: Scheduled:    . aspirin  300 mg Rectal Daily   Or  . aspirin  325 mg Oral Daily  . cholecalciferol  5,000 Units Oral q morning - 10a  . diltiazem  120 mg Oral q morning - 10a  . heparin  5,000 Units Subcutaneous Q8H  . insulin aspart  0-9 Units Subcutaneous TID WC  . levothyroxine  112 mcg Oral Q breakfast  . multivitamin with minerals  1 tablet Oral Daily  . nitrofurantoin  100 mg Oral q morning - 10a  . sodium chloride  3 mL Intravenous Q12H  . vitamin C  1,000 mg Oral Daily   Neurologic Exam: performed by Dr. Pearlean Brownie. Mental Status: Alert, oriented, thought content appropriate.  Speech fluent without evidence of aphasia. Able to follow 3 step commands without difficulty. Cranial Nerves: II- Visual fields grossly intact. III/IV/VI-Extraocular movements intact.  Pupils reactive  bilaterally. V/VII-Smile symmetric VIII-hearing grossly intact IX/X-normal gag XI-bilateral shoulder shrug XII-midline tongue extension Motor: 5/5 bilaterally with normal tone and bulk.diminished fine finger movements on right. Orbits left over right upper extremity Sensory: Light touch intact throughout, bilaterally Deep Tendon Reflexes: 2+ and symmetric throughout Plantars: Downgoing bilaterally Cerebellar: Impaired fine finger movements on the right.  Lab Results: Basic Metabolic Panel:  Lab 05/26/12 2956 05/25/12 0059 05/24/12 1947  NA 136 -- 132*  K 3.8 -- 4.0  CL 103 -- 98  CO2 23 -- 22  GLUCOSE 113* -- 192*  BUN 15 -- 24*  CREATININE 0.74 0.75 --  CALCIUM 9.1 -- 9.6  MG -- -- --  PHOS -- -- --   Liver Function Tests:  Lab 05/24/12 1947  AST 19  ALT 16  ALKPHOS 151*  BILITOT 0.2*  PROT 6.7  ALBUMIN 3.2*   CBC:  Lab 05/26/12 0530 05/25/12 0540 05/24/12 1947  WBC 15.2* 15.3* --  NEUTROABS -- -- 12.3*  HGB 12.0 11.9* --  HCT 36.4 36.6 --  MCV 77.0* 76.4* --  PLT 607* 642* --   CBG:  Lab 05/26/12 0642 05/25/12 2144 05/25/12 1659 05/25/12 1217 05/25/12 0641 05/25/12 0321  GLUCAP 121* 114* 174* 164* 163* 137*   Fasting Lipid Panel:  Lab 05/25/12 0540  CHOL 200  HDL 72  LDLCALC 119*  TRIG 45  CHOLHDL 2.8  LDLDIRECT --  Coagulation:  Lab 05/24/12 1947  LABPROT 13.1  INR 1.00    Lab 05/25/12 0012  COLORURINE YELLOW  LABSPEC 1.016  PHURINE 6.5  GLUCOSEU NEGATIVE  HGBUR NEGATIVE  BILIRUBINUR NEGATIVE  KETONESUR NEGATIVE  PROTEINUR NEGATIVE  UROBILINOGEN 0.2  NITRITE NEGATIVE  LEUKOCYTESUR SMALL*   Study Results  CT Head 05/24/2012  No intracranial hemorrhage or CT evidence of large acute infarct.  Remote infarcts and small vessel disease type changes  MRI Brain 05/25/2012  Acute non hemorrhagic infarcts involve portions of the left frontal lobe, left parietal lobe and left occipital lobe.    MRA Head/brain 05/25/2012 Decreased number  visualized left middle cerebral artery branch vessels consistent with the patient's acute infarct.  No high-grade proximal medium or large size vessel significant stenosis.  Embolic disease as cause for the infarction not excluded.    Carotid Doppler No evidence of hemodynamically significant internal carotid artery stenosis. Vertebral artery flow is antegrade.   2D Echo EF 65-70% with no source of embolus. Mild to moderate AS.  EKG not done; tele shows consistent tachycardia 140-160s  Therapies:pending  Assessment: 78 year old female presents with sudden onset right hemiparesis and aphasia which has nearly resolved. MRI reveals multiple small left MCA branch vessel infarct. Infarcts felt to be embolic, secondary to unknown etiology. Telemetry shows episodes of tachycardia, no atrial fibrillation. Not on an antiplatelet prior to admission. Now on aspirin. Likely not a coumadin candidate given advanced age and risk of fall. Do not recommend further embolic evaluation.    LDL 119; not at goal < 70  Diabetes, A1C 7.9 Hypertension  Plan: Add low dose statin Prophylactic therapy-Antiplatelet med: Aspirin 325 daily No further workup Ok for discharge from stroke standpoint Follow up with Dr. Pearlean Brownie, Stroke Clinic, in 2 months.   LOS: 2 days   Tina Main, MSN, RN, ANVP-BC, ANP-BC, GNP-BC [facility] Pager: [phone] 05/26/2012 4:10 PM  Scribe for Dr. Delia Heady, Stroke Center Medical Director, who has personally reviewed chart, pertinent data, examined the patient and developed the plan of care. Pager:  [phone]

## 2012-05-26 NOTE — Evaluation (Signed)
Speech Language Pathology Evaluation Patient Details Name: Tina Munoz MRN: [id number] DOB: 22-May-1918 Today's Date: 05/26/2012 Time:  -     Problem List:  Patient Active Problem List  Diagnosis  . TIA (transient ischemic attack)  . HTN (hypertension)  . Hearing loss  . Leukocytosis  . DM type 2 (diabetes mellitus, type 2)  . Hypothyroid   Past Medical History:  Past Medical History  Diagnosis Date  . Hypertension   . Diabetes mellitus   . Thyroid disease   . Congestive heart failure (CHF)   . Heart murmur    Past Surgical History: History reviewed. No pertinent past surgical history. HPI:  78 y/o female with h/x of HTN, diabetes, and thyroid disease.  TIA on 9/21. MR showed Acute non hemorrhagic infarcts involve portions of the left frontal lobe, left parietal lobe and left occipital lobe.    Assessment / Plan / Recommendation Clinical Impression  Pt. presents with mild cognitive-linguistic impairments affecting reading comprehension, working memory, and anticipatory awareness. Premorbid defeicits of hearing loss and visual disturbance (disconjugate gaze, per MD notes).  Using tablet with magnified text, Pt. able to read approx. 75% of sentences; difficulties in understanding what she has read are evident when asked to explain or answer questions about text. Per Pt.'s daughters, she has had some difficulty remembering their names and SLP observed Pt. having difficulty recalling home address. Anticipatory awareness is also decreased, characterized by and inability to foresee her stay in hospital, stating "I am leaving tonight" as well as difficulty answering problem solving questions. Will continue to follow at bedside for facilitation of improved cognition as it affects basic ADLs. SLP also recommends home health ST. Pt. reads a lot during day, does not go out of house often, and daughters manage finances and medications.     SLP Assessment  Patient needs continued Speech  [place]    Follow Up Recommendations  Home health SLP    Frequency and Duration min 2x/week  2 weeks      SLP Goals  SLP Goals Potential to Achieve Goals: Good Potential Considerations: Ability to learn/carryover information Progress/Goals/Alternative treatment plan discussed with pt/caregiver and they: Agree SLP Goal #1: Pt. will recall strategies to improve working memory related to hospital environment with min verbal cueing.  SLP Goal #2: Pt. will increase anticpatory awareness of basic ADLs with min verbal cues  SLP Goal #3: Pt. will improve reading comprehension, answering 4/5 comprehension questions with min verbal cueing.   SLP Evaluation Prior Functioning  Cognitive/Linguistic Baseline: Baseline deficits Type of Home: House Lives With: Daughter Available Help at Discharge: Family Vocation: Unemployed   Cognition  Overall Cognitive Status: Impaired Arousal/Alertness: Awake/alert Orientation Level: Oriented X4 Attention: Selective Selective Attention: Appears intact Memory: Impaired Memory Impairment: Retrieval deficit;Prospective memory;Decreased short term memory;Decreased recall of new information;Decreased long term memory Decreased Short Term Memory: Verbal basic;Functional basic Awareness: Impaired Awareness Impairment: Anticipatory impairment;Emergent impairment Problem Solving: Impaired Problem Solving Impairment: Verbal basic;Functional basic Executive Function: Self Monitoring;Self Correcting Self Monitoring: Impaired Self Monitoring Impairment: Verbal basic;Functional basic Self Correcting: Impaired Self Correcting Impairment: Verbal basic;Functional basic Safety/Judgment: Appears intact    Comprehension  Auditory Comprehension Overall Auditory Comprehension: Impaired (hard of hearing ) Yes/No Questions: Not tested Commands: Impaired Multistep Basic Commands: 75-100% accurate Conversation: Simple Interfering Components:  Hearing;Working Radio broadcast assistant: Increased volume;Stressing words;Visual/Gestural cues Counsellor: Not tested Reading Comprehension Reading Status: Impaired Word level: Impaired Sentence Level: Impaired Paragraph Level: Not tested Functional Environmental (signs, name badge): Impaired (  menu) Effective Techniques: Large print;Visual cueing    Expression Expression Primary Mode of Expression: Verbal Verbal Expression Overall Verbal Expression: Appears within functional limits for tasks assessed Initiation: No impairment Level of Generative/Spontaneous Verbalization: Conversation Naming: Impairment (3 of 4 mildly abstract objects correct) Responsive: Not tested Confrontation: Impaired Convergent: Not tested Divergent: Not tested Verbal Errors:  (aware of the errors part of the time ) Pragmatics: No impairment Interfering Components: Premorbid deficit Written Expression Dominant Hand: Right Written Expression: Exceptions to [facility]   Oral / Motor Oral Motor/Sensory Function Overall Oral Motor/Sensory Function: Impaired Labial ROM: Within Functional Limits Labial Symmetry: Within Functional Limits Labial Strength: Within Functional Limits Labial Sensation: Within Functional Limits Lingual ROM: Other (Comment) (reduced elevation and depression ) Lingual Symmetry: Within Functional Limits Lingual Strength: Within Functional Limits Lingual Sensation: Within Functional Limits Facial ROM: Within Functional Limits Facial Symmetry: Within Functional Limits Facial Strength: Reduced Facial Sensation: Within Functional Limits Velum: Within Functional Limits Mandible: Within Functional Limits Motor Speech Overall Motor Speech: Appears within functional limits for tasks assessed Respiration: Within functional limits Phonation: Normal Resonance: Within functional limits Articulation: Within functional limitis Intelligibility:  Intelligible Motor Planning: Witnin functional limits Motor Speech Errors: Not applicable   GO     Tina Munoz 05/26/2012, 2:04 PM

## 2012-05-26 NOTE — Progress Notes (Signed)
INITIAL ADULT NUTRITION ASSESSMENT Date: 05/26/2012   Time: 3:05 PM  Reason for Assessment: Nutrition Risk (MST=3)  INTERVENTION: Boost Plus (chocolate) PO BID to maximize oral intake   DOCUMENTATION CODES Per approved criteria  -Non-severe (moderate) malnutrition in the context of acute illness or injury    ASSESSMENT: Female 78 y.o.  Dx: TIA (transient ischemic attack)  Hx:  Past Medical History  Diagnosis Date  . Hypertension   . Diabetes mellitus   . Thyroid disease   . Congestive heart failure (CHF)   . Heart murmur     History reviewed. No pertinent past surgical history.  Related Meds:  Scheduled Meds:   . aspirin  300 mg Rectal Daily   Or  . aspirin  325 mg Oral Daily  . cholecalciferol  5,000 Units Oral q morning - 10a  . diltiazem  120 mg Oral q morning - 10a  . heparin  5,000 Units Subcutaneous Q8H  . insulin aspart  0-9 Units Subcutaneous TID WC  . levothyroxine  112 mcg Oral Q breakfast  . multivitamin with minerals  1 tablet Oral Daily  . sodium chloride  3 mL Intravenous Q12H  . vitamin C  1,000 mg Oral Daily  . DISCONTD: nitrofurantoin  100 mg Oral q morning - 10a   Continuous Infusions:  PRN Meds:.sodium chloride, senna-docusate, sodium chloride   Ht: 5\' 8"  (172.7 cm)  Wt: 141 lb (63.957 kg)  Ideal Wt: 63.6 kg  % Ideal Wt: 100%  Usual Wt: 148 lb % Usual Wt: 95%  Body mass index is 21.44 kg/(m^2).  Food/Nutrition Related Hx: 5% weight loss over the past month  Labs:  CMP     Component Value Date/Time   NA 136 05/26/2012 0530   K 3.8 05/26/2012 0530   CL 103 05/26/2012 0530   CO2 23 05/26/2012 0530   GLUCOSE 113* 05/26/2012 0530   BUN 15 05/26/2012 0530   CREATININE 0.74 05/26/2012 0530   CALCIUM 9.1 05/26/2012 0530   PROT 6.7 05/24/2012 1947   ALBUMIN 3.2* 05/24/2012 1947   AST 19 05/24/2012 1947   ALT 16 05/24/2012 1947   ALKPHOS 151* 05/24/2012 1947   BILITOT 0.2* 05/24/2012 1947   GFRNONAA 72* 05/26/2012 0530   GFRAA 83*  05/26/2012 0530    CBG (last 3)   Basename 05/26/12 1158 05/26/12 0642 05/25/12 2144  GLUCAP 162* 121* 114*    Diet Order: CHO-modified medium PO's:  25% meal completion  IVF:  none  Estimated Nutritional Needs:   Kcal: 1400-1500 Protein: 75-85 grams Fluid: 1.4-1.6 liters  Weight dropped around 15 lb during a recent stay in a facility for rehab because she did not like the food at the facility.  Weight has dropped even further over the past month.  Patient enjoys the food she has been receiving in the hospital.  Drinks Boost supplement at home sometimes.  Intake PTA was poor per discussion with patient's family member.  Pt meets criteria for non-severe (moderate) malnutrition in the context of acute illness as evidenced by 5% weight loss in 1 month and intake < 75% estimated energy requirement for >7 days.   NUTRITION DIAGNOSIS: -Inadequate oral intake (NI-2.1).  Status: Ongoing  RELATED TO: poor appetite  AS EVIDENCE BY: 5% weight loss in the past month and 25% meal completion  MONITORING/EVALUATION(Goals): Goal:  Intake to meet >90% of estimated nutrition needs. Monitor:  PO intake, labs, weight trend.  EDUCATION NEEDS: -Education not appropriate at this time   Ramsay  Tiburcio Pea, RD, LDN, CNSC Pager# [phone] After Hours Pager# [phone]  05/26/2012, 3:05 PM

## 2012-05-26 NOTE — Discharge Summary (Signed)
Physician Discharge Summary  Tina Munoz ZOX:[id number] DOB: May 08, 1918 DOA: 05/24/2012  PCP: Lorie Phenix, MD  Admit date: 05/24/2012 Discharge date: 05/26/2012  Recommendations for Outpatient Follow-up:  1. Followup with Dr. Delia Heady, neurologist in 2 months from hospital discharge. 2. Followup with primary care physician in 2 weeks from hospital discharge.  Discharge Diagnoses:  Principal Problem:  *TIA (transient ischemic attack) Active Problems:  HTN (hypertension)  Hearing loss  Leukocytosis  DM type 2 (diabetes mellitus, type 2)  Hypothyroid   Discharge Condition: Improved and stable.  Diet recommendation: Heart healthy and diabetic diet.  Filed Weights   05/24/12 1956 05/25/12 0309  Weight: 70.761 kg (156 lb) 63.957 kg (141 lb)    History of present illness:  78 y.o. female who was in her normal state of health, when out to dinner and got background 6 or 6:15, then around 6:40 p.m. on 05/24/12 was noted to have significant trouble with her right arm and difficulty speaking. EMS was called and brought her into the emergency room as a code stroke. On arrival, her son and daughter-in-law are able to state that she seemed significantly better than when they had seen her earlier. However, she did have continued right arm weakness and some difficulty speaking. She was not a TPA candidate secondary to rapid improvement and advanced age.   Hospital Course:  1. Multiple small left MCA branch vessel infarcts, likely embolic with right hemiparesis and aphasia: Aphasia has resolved. Patient has minimal right fingers dexterity deficit-improved. Stroke workup was completed. Neurology was consulted. Patient was placed on full dose aspirin for secondary stroke prophylaxis. Telemetry monitor showed periodic nonsustained narrow complex tachycardia, suspicious for SVT. Even if patient had a A. fib, patient not a candidate for anticoagulation due to advanced age, fall risk and blindness.  Same was discussed at length with patient's son Mr. Tranae Eickmeyer and he verbalized understanding. Patient was thereby started on aspirin 325 mg daily and simvastatin 10 mg daily. She also has newly diagnosed type diabetes. 2. Hypertension: Patient was continued on home medications. 3. Type 2 diabetes mellitus: Hemoglobin A1c 7.9. Started patient on Amaryl 0.5 mg daily. Advised son regarding diabetic diet, CBG checks and monitoring and treatment of hypoglycemia. He verbalized understanding. 4. Hypothyroidism: Continue Synthroid. TSH 5.833. May consider increasing Synthroid as outpatient. 5. Hyponatremia: Resolved. 6. Leukocytosis: No clinical focus of infection.? Stress response. 7. Mild anemia and thrombocytosis. Anemia stable and thrombocytosis is improving. Periodic followup CBC as outpatient. 8. PSVT: Continue Cardizem and aspirin.  Procedures:  None  Consultations:  Neurology  Discharge Exam:  Complaints Patient eager to go home and denies complaints.  Filed Vitals:   05/26/12 0136 05/26/12 0526 05/26/12 1104 05/26/12 1412  BP: 142/88 145/77 147/80 133/62  Pulse: 81 81 73 77  Temp: 97.4 F (36.3 C) 97.6 F (36.4 C) 98 F (36.7 C) 97.9 F (36.6 C)  TempSrc: Oral Oral  Oral  Resp: 20 20  20   Height:      Weight:      SpO2: 97% 99% 97% 97%    General exam: Moderately built and nourished elderly female patient, sitting comfortably on the chair and in no obvious distress. Hard of hearing.  Respiratory system: Clear to auscultation. No increased work of breathing.  Cardiovascular system: S1 and S2 heard, regular rate and rhythm. No JVD, murmurs or gallops or pedal edema. Telemetry shows sinus rhythm in the 70s to 80s and occasional runs of nonsustained SVT.  Gastrointestinal system: Nondistended, soft and nontender.  Normal bowel sounds heard.  Central nervous system: Alert and oriented. Hard of hearing. No cranial nerve deficits.  Extremities: Grade 5 x 5 power in all  limbs.  Discharge Instructions      Discharge Orders    Future Orders Please Complete By Expires   Diet - low sodium heart healthy      Diet Carb Modified      Increase activity slowly      Call MD for:  persistant dizziness or light-headedness      Call MD for:  extreme fatigue          Medication List     As of 05/27/2012  7:03 PM    STOP taking these medications         nitrofurantoin 100 MG capsule   Commonly known as: MACRODANTIN      TAKE these medications         aspirin EC 325 MG tablet   Take 1 tablet (325 mg total) by mouth daily.      diltiazem 240 MG 24 hr capsule   Commonly known as: DILACOR XR   Take 240 mg by mouth every morning.      glimepiride 1 MG tablet   Commonly known as: AMARYL   Take 0.5 tablets (0.5 mg total) by mouth daily before breakfast.      levothyroxine 112 MCG tablet   Commonly known as: SYNTHROID, LEVOTHROID   Take 112 mcg by mouth every morning.      lisinopril 10 MG tablet   Commonly known as: PRINIVIL,ZESTRIL   Take 10 mg by mouth every morning.      LUTEIN-ZEAXANTHIN PO   Take 1 tablet by mouth daily.      multivitamin with minerals Tabs   Take 1 tablet by mouth daily.      simvastatin 10 MG tablet   Commonly known as: ZOCOR   Take 1 tablet (10 mg total) by mouth at bedtime.      vitamin C 1000 MG tablet   Take 1,000 mg by mouth daily.      Vitamin D-3 5000 UNITS Tabs   Take 1 tablet by mouth every morning.         Follow-up Information    Follow up with Gates Rigg, MD. Schedule an appointment as soon as possible for a visit in 2 months.   Contact information:   [address] [facility] [place] [address] [phone]       Follow up with MALONEY,NANCY, MD. Schedule an appointment as soon as possible for a visit in 2 weeks.   Contact information:   [address] [place] [address] [phone]           The results of significant  diagnostics from this hospitalization (including imaging, microbiology, ancillary and laboratory) are listed below for reference.    Significant Diagnostic Studies:  CT Head 05/24/2012 No intracranial hemorrhage or CT evidence of large acute infarct. Remote infarcts and small vessel disease type changes   MRI Brain 05/25/2012 Acute non hemorrhagic infarcts involve portions of the left frontal lobe, left parietal lobe and left occipital lobe.   MRA Head/brain 05/25/2012 Decreased number visualized left middle cerebral artery branch vessels consistent with the patient's acute infarct. No high-grade proximal medium or large size vessel significant stenosis. Embolic disease as cause for the infarction not excluded.  Carotid Doppler No evidence of hemodynamically significant internal carotid artery stenosis. Vertebral artery flow is antegrade.  2D Echo EF 65-70% with no source of embolus. Mild to moderate AS.  EKG on 9/23: Sinus rhythm with first degree AV block,? LAD but no acute changes.  Microbiology: No results found for this or any previous visit (from the past 240 hour(s)).   Labs: Basic Metabolic Panel:  Lab 05/26/12 1610 05/25/12 0059 05/24/12 1947  NA 136 -- 132*  K 3.8 -- 4.0  CL 103 -- 98  CO2 23 -- 22  GLUCOSE 113* -- 192*  BUN 15 -- 24*  CREATININE 0.74 0.75 1.04  CALCIUM 9.1 -- 9.6  MG -- -- --  PHOS -- -- --   Liver Function Tests:  Lab 05/24/12 1947  AST 19  ALT 16  ALKPHOS 151*  BILITOT 0.2*  PROT 6.7  ALBUMIN 3.2*   No results found for this basename: LIPASE:5,AMYLASE:5 in the last 168 hours No results found for this basename: AMMONIA:5 in the last 168 hours CBC:  Lab 05/26/12 0530 05/25/12 0540 05/25/12 0059 05/24/12 1947  WBC 15.2* 15.3* 16.8* 17.4*  NEUTROABS -- -- -- 12.3*  HGB 12.0 11.9* 12.6 13.0  HCT 36.4 36.6 37.7 39.6  MCV 77.0* 76.4* 75.6* 76.6*  PLT 607* 642* 697* 704*   Cardiac Enzymes:  Lab 05/24/12 1947  CKTOTAL --  CKMB --    CKMBINDEX --  TROPONINI <0.30   BNP: BNP (last 3 results) No results found for this basename: PROBNP:3 in the last 8760 hours CBG:  Lab 05/26/12 1710 05/26/12 1158 05/26/12 0642 05/25/12 2144 05/25/12 1659  GLUCAP 119* 162* 121* 114* 174*   Other lab data: 1. CBGs ranged between 114-162 mg/dL. 2. Lipid panel: Cholesterol 200, triglycerides 45, HDL 72, LDL 119, VLDL 9. 3. Hemoglobin A1c: 7.9. 4. TSH: 5.833. 5. UA: Not suggestive of UTI.   Time coordinating discharge: Greater than 30 minutes  Signed:  Corrine Tillis  [facility] 05/27/2012, 7:03 PM

## 2012-05-27 DIAGNOSIS — I639 Cerebral infarction, unspecified: Secondary | ICD-10-CM | POA: Diagnosis present

## 2012-06-23 ENCOUNTER — Ambulatory Visit: Payer: Self-pay | Admitting: Family Medicine

## 2014-04-03 DEATH — deceased

## 2014-08-20 IMAGING — CR DG ABDOMEN 3V
1 series · 4 of 4 positions shown · non-contrast
Comparison: none

REASON FOR EXAM: constipation
COMMENTS:

[Series 1: pa · 0.17mm/px · 4 of 4 slices shown]
[im 1/4]
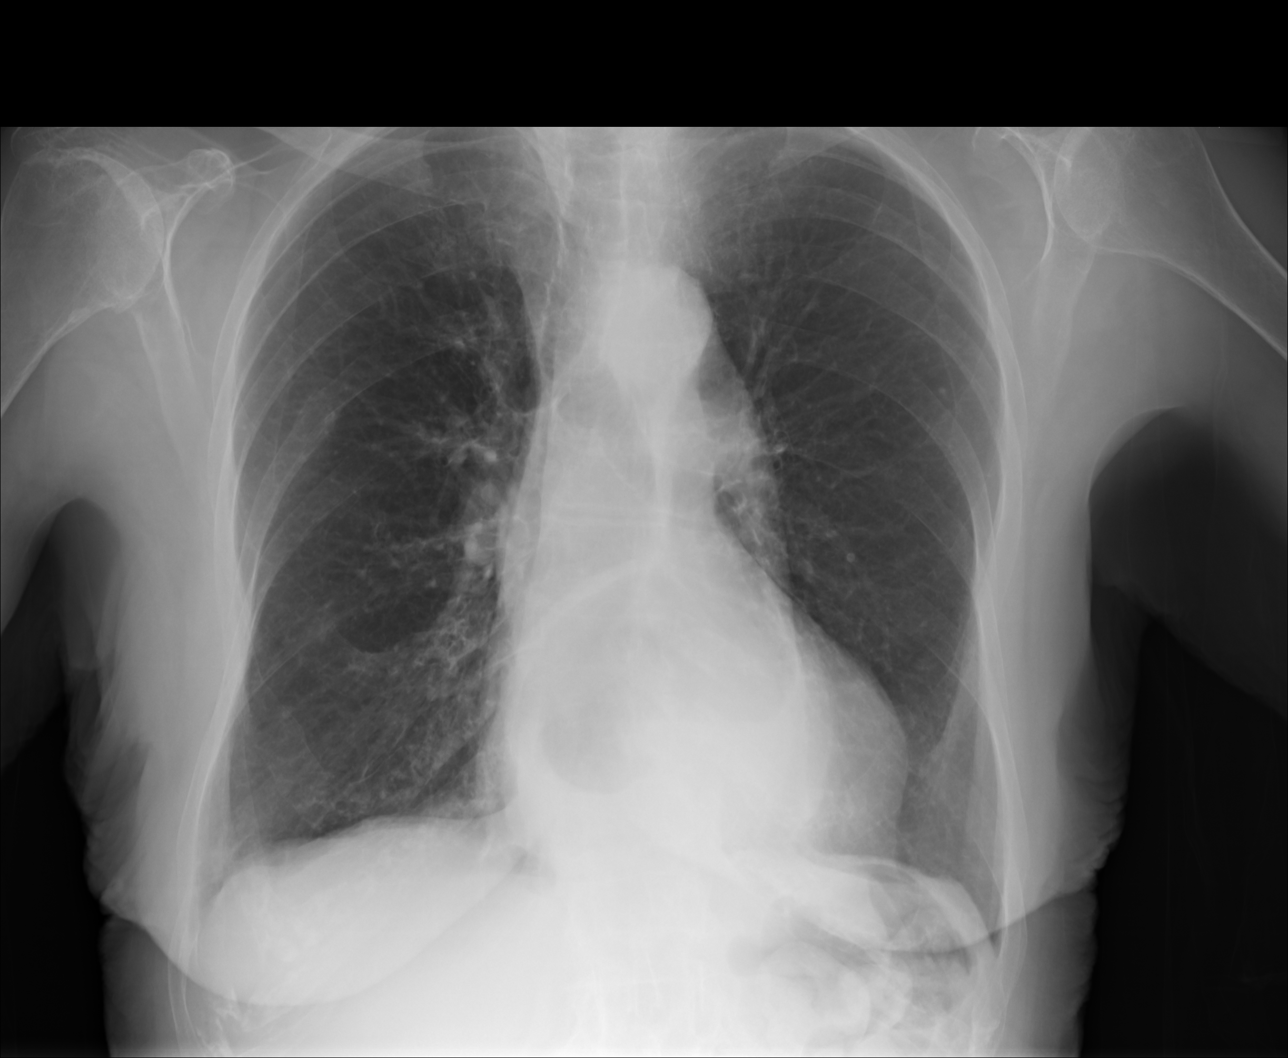
[im 2/4]
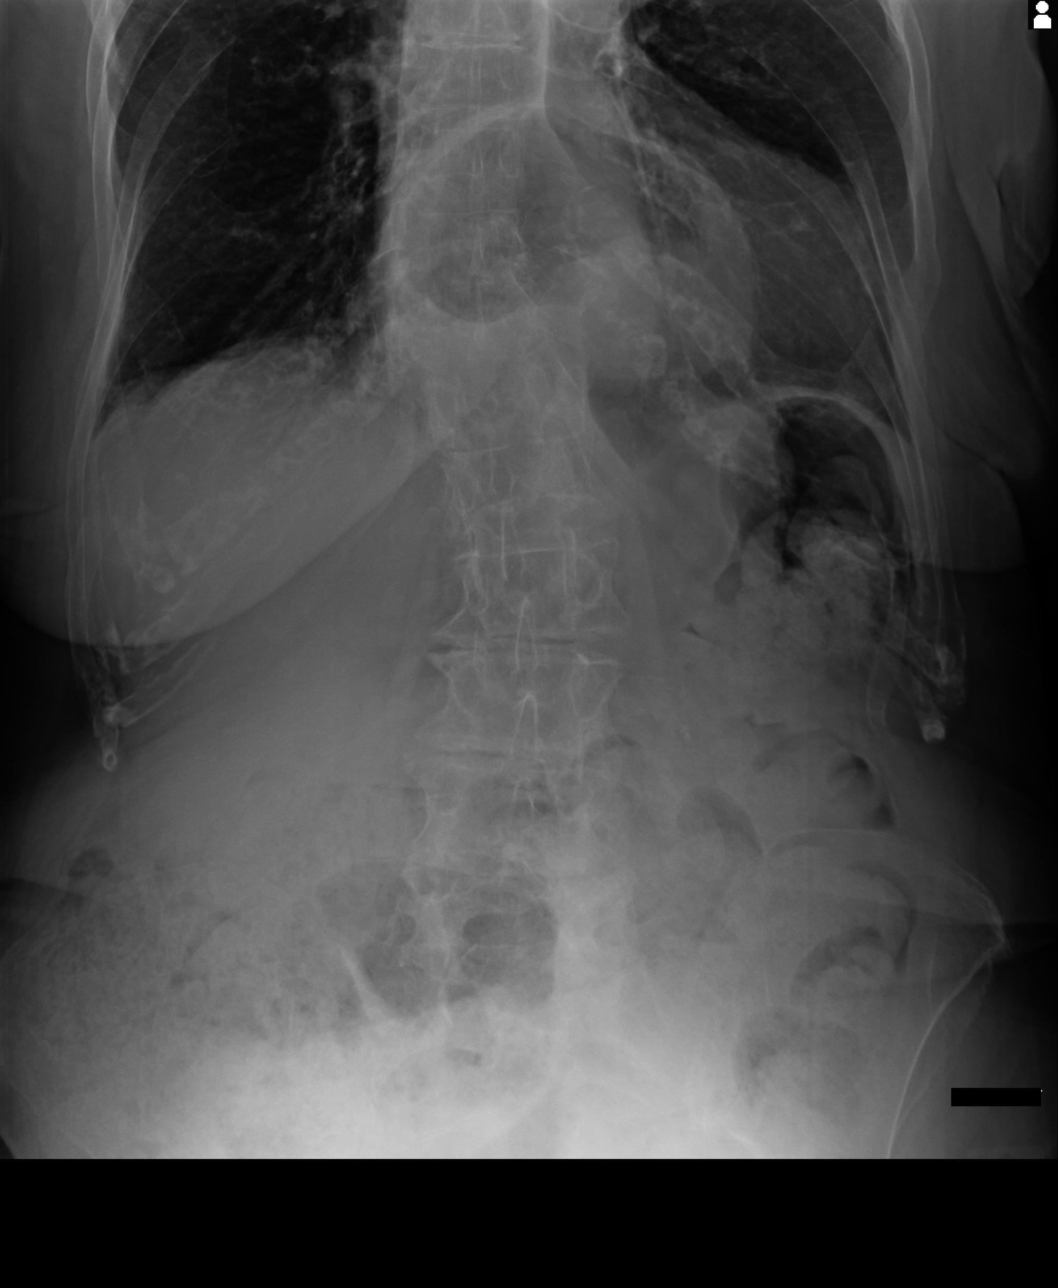
[im 3/4]
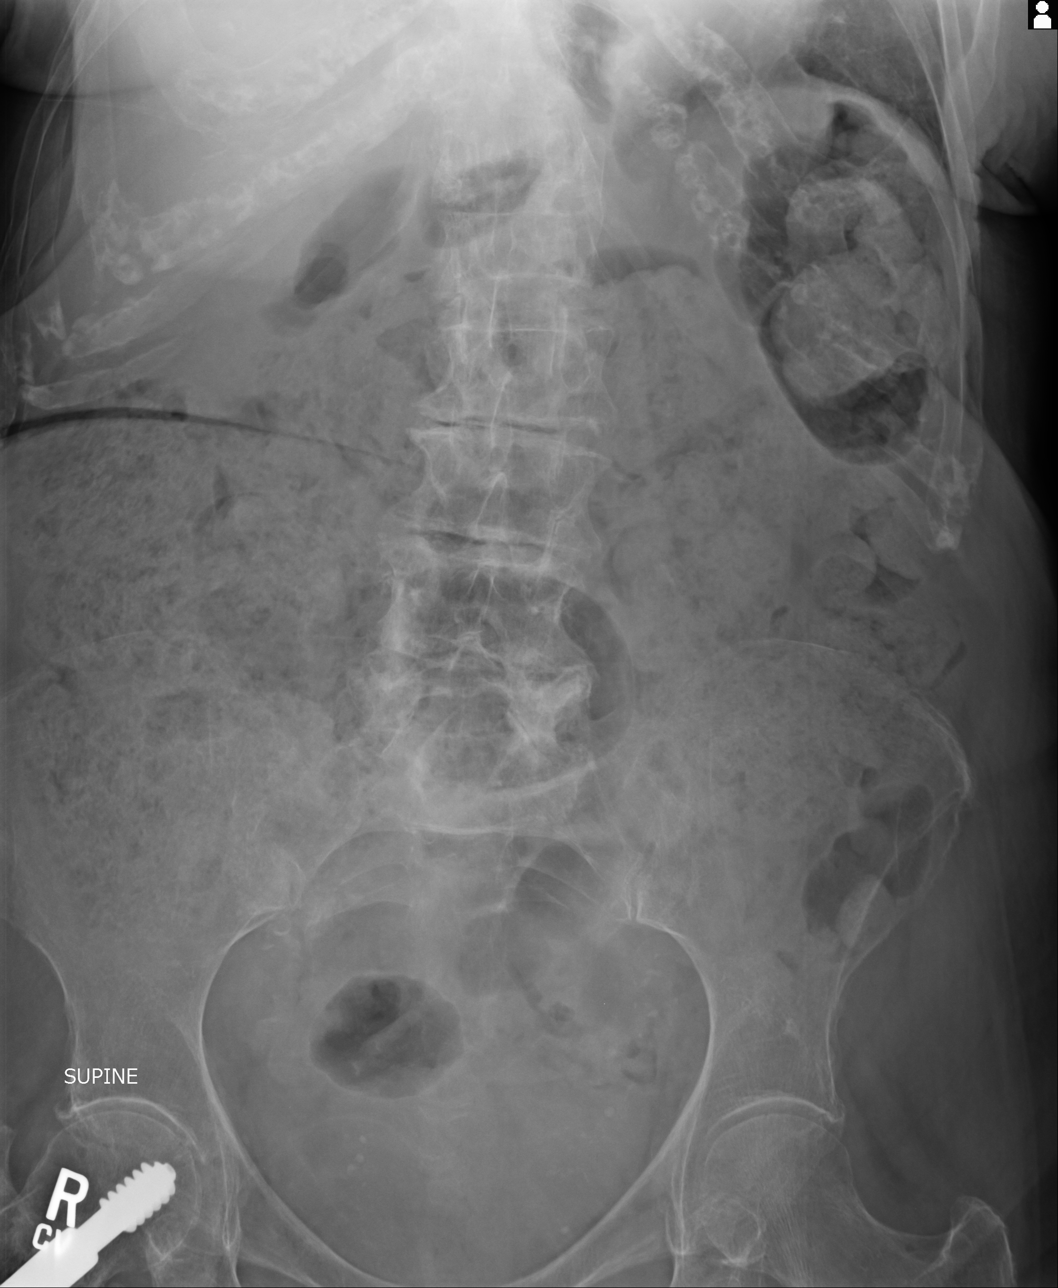
[im 4/4]
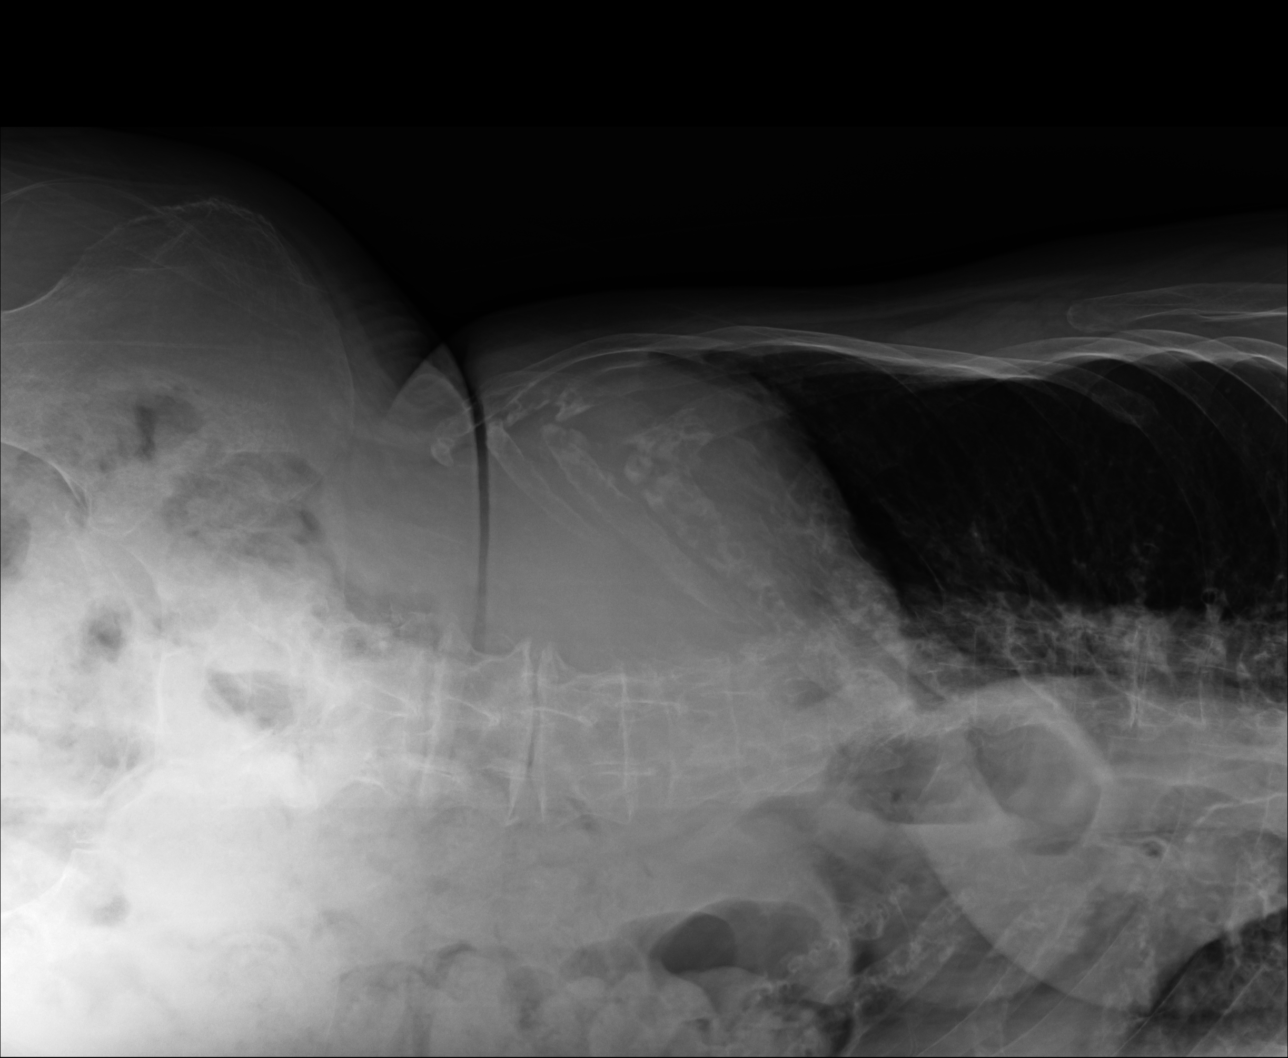

[4 of 4 positions shown; findings below may reference images not displayed]

PROCEDURE:     KDR - KDXR ABDOMEN 3-WAY(INC PA CXR)  - June 23, 2012 [DATE]

RESULT:

Frontal view of the chest demonstrates no evidence of focal infiltrates,
effusions or edema. An area of increased density with an air-fluid level
projects in the retrocardiac region.

A large amount of stool is appreciated within the colon. Air is seen within
nondilated loops of large and small bowel. The visualized bony skeleton
demonstrates degenerative change within the lumbar spine as well as S-shaped
scoliotic curvature.
IMPRESSION: 1. COPD with likely a hiatal hernia.
2. Large amount of stool within the colon with a nonobstructive bowel gas
pattern.

## 2014-12-26 NOTE — Consult Note (Signed)
Chief Complaint:   Subjective/Chief Complaint Was seen for preop clearance, waiting for surgery this afternoon, no new issues   VITAL SIGNS/ANCILLARY NOTES: **Vital Signs.:   03-Apr-13 21:10   Vital Signs Type Q 4hr   Temperature Temperature (F) 97.6   Celsius 36.4   Temperature Source oral   Pulse Pulse 77   Pulse source per Dinamap   Respirations Respirations 18   Systolic BP Systolic BP 123   Diastolic BP (mmHg) Diastolic BP (mmHg) 75   Mean BP 91   Pulse Ox % Pulse Ox % 94   Pulse Ox Activity Level  At rest   Oxygen Delivery 2L   Nurse Fingerstick (mg/dL) FSBS (fasting range 16-1065-99 mg/dL) [id number]   Brief Assessment:   Cardiac Regular  murmur present    Respiratory normal resp effort  clear BS    Gastrointestinal Normal   Assessment/Plan:  Assessment/Plan:   Assessment 1. High Blood Pressure (Hypertension): stable home meds, holding HCTZ though because of mild dehydration clinnically 3. HL, low fat diet 4. DM, SSI , Hgb a1c 5. leukocytosis, likely stress related, following 6. cardiac murmur, unknown significance, echocardiogram showing normal lv function, valvular regurg, family is aware of risks, cardizem for rate control 7. hip fracture: surgery planned this afternoon    Plan time spent: 15 mins   Electronic Signatures: Patricia PesaShah, Annetta Deiss S (MD)  (Signed 04-Apr-13 11:00)  Authored: Chief Complaint, VITAL SIGNS/ANCILLARY NOTES, Brief Assessment, Assessment/Plan   Last Updated: 04-Apr-13 11:00 by Patricia PesaShah, Carmina Walle S (MD)

## 2014-12-26 NOTE — Consult Note (Signed)
PATIENT NAME:  Tina Munoz, Tina Munoz MR#:  [id number] DATE OF BIRTH:  07-Dec-1917  DATE OF CONSULTATION:  12/04/2011  REFERRING PHYSICIAN:  Reita Chardhris Smith, MD   CONSULTING PHYSICIAN:  Katharina Caperima Shawndale Kilpatrick, MD  PRIMARY CARE PHYSICIAN: Leo GrosserNancy J. Maloney, MD   REASON FOR CONSULTATION: Medical clearance.   HISTORY OF PRESENT ILLNESS: The patient is a 81 year old Caucasian female with a history of hypertension, elevated blood glucose levels, presented to the hospital after she stumbled over the dog or just tripped on the deck. Apparently she fell down and hit her head on the table as well as hit her right hip and broke her right hip. She was not able to stand up because of severe pains. She was able to drag herself, however, close to the house and called Med-Alert. Her daughter came in and brought her here to the Emergency Room for further evaluation. In the Emergency Room she was noted to have right intertrochanteric hip fracture, and Hospitalist Services were contacted for consultation for preoperative consultations. The patient is complaining of right hip pains.   PAST MEDICAL HISTORY: Significant for:  1. History of hyperglycemia. 2. Hypertension. 3. History of hemorrhoids.  4. History of a known heart murmur.  5. Hyperlipidemia.  6. Hypothyroidism. 7. History of recurrent urinary tract infections in the past.  8. Questionable dementia. 9. History of allergic rhinitis. 10. Arthritis.  11. Anemia. 12. Osteoporosis.  13. Noninsulin-dependent diabetes mellitus. 14. Cataract.   MEDICATIONS:  1. Diltiazem 240 mg p.o. daily. 2. Hydrochlorothiazide/lisinopril 12.5/10 mg p.o. daily.  3. Levoxyl 112 mcg p.o. daily.  4. Zeaxanthin 1 capsule once daily.  5. Multivitamins once daily.  6. Nitrofurantoin microcrystals monohydrate 100 mg p.o. daily.  7. Tylenol Extra Strength 500/25 mg p.o. at bedtime as needed.  8. Vitamin C 1000 mg p.o. daily.  9. Vitamin D3 5000 units p.o. daily   PAST SURGICAL HISTORY:   1. Right eye cataract surgery in May 2012.  2. Dislocated right elbow in the past.    ALLERGIES: No known drug allergies.   FAMILY HISTORY: Significant for hypertension in the patient's mother. The patient's grandmother had goiter. The patient's sister died of an unknown cancer.   SOCIAL HISTORY: The patient is widowed for 15 years now, lives with her daughter. No smoking. No alcohol abuse. She used to work as a Runner, broadcasting/film/videoteacher, is now retired.   REVIEW OF SYSTEMS: Review of systems is positive for some blurring of vision as well as double vision. She uses bifocals as well as reading glasses. Also she uses Med-Alert for different problems. Recently she had pinched  her left arm and used Med-Alert for the same. CONSTITUTIONAL: She denies any fevers, chills, fatigue, weakness, pains, weight loss or gain. EYES: In regards to eyes, denies any blurry vision, double vision, glaucoma, or cataracts. ENT: Denies any tinnitus, allergies, epistaxis, sinus pain, dentures, difficulty swallowing. RESPIRATORY: Denies any cough, wheezes, asthma, or chronic obstructive pulmonary disease. CARDIOVASCULAR: Denies any chest pains, orthopnea, edema, arrhythmias, palpitations or syncope. GASTROINTESTINAL: Denies any nausea, vomiting, diarrhea, or constipation. GENITOURINARY: Denies dysuria, hematuria, frequency, or incontinence. ENDOCRINOLOGY: Denies any polydipsia, nocturia, thyroid problems, heat or cold intolerance or thirst. HEMATOLOGIC: Denies anemia, easy bruising, bleeding, or swollen glands. SKIN: Denies any acne, rashes, lesions, or change in moles. MUSCULOSKELETAL: Denies arthritis, cramps, swelling, or gout. NEUROLOGICAL: Denies numbness, epilepsy, or tremor. PSYCHIATRIC: Denies anxiety, insomnia, or depression.    PHYSICAL EXAMINATION:  VITAL SIGNS: On arrival to the hospital, temperature is 97.5, pulse 80, respiration rate  18, blood pressure 160/82, saturation 98% on room air.   GENERAL: The patient is a  well-developed, well-nourished Caucasian female in no significant distress, somewhat uncomfortable, especially whenever she tries to move in the bed because of severe right hip pains.   HEENT: Her pupils are equal and reactive to light. She does have skew deviation of her eyes when her right eye seemed to be deviated bilaterally. No icterus or conjunctivitis. However, she has mild conjunctival injection especially on the right. Significant difficulty hearing. She does have hearing aids. No pharyngeal erythema. Mucosa is dry.   NECK: Neck did not reveal any masses, supple, nontender. Thyroid is not enlarged. No adenopathy. No JVD or carotid bruits bilaterally. Full range of motion.   LUNGS: Clear to auscultation in all fields anteriorly. No rales, rhonchi, diminished breath sounds or wheezing. No labored inspirations, increased effort, dullness to percussion, or overt respiratory distress.   CARDIOVASCULAR: S1, S2 appreciated. The patient does have a systolic murmur, 4 out of 6, in aortic as well as mitral auscultation site. Murmur is radiating to her axilla as well as into her neck. PMI is not lateralized. Chest is nontender to palpation.   EXTREMITIES: 1+ pedal pulses. Trace lower extremity edema. No calf tenderness or cyanosis was noted. Right lower extremity seemed to be externally rotated but no shortening was noted.   ABDOMEN: Soft, nontender. Bowel sounds are present. No hepatomegaly or masses were noted.   RECTAL: Deferred.   MUSCULOSKELETAL: Muscle strength: Able to move all extremities except right lower extremity. No cyanosis, degenerative joint disease. I am not able to assess for kyphosis. Gait is not tested.   SKIN: Skin did not reveal any rashes, lesions, erythema, nodularity, or induration. It was warm and dry to palpation.   LYMPH: No adenopathy in the cervical region.   NEUROLOGICAL: Cranial nerves are grossly intact. Sensory is intact. No dysarthria or aphasia. The patient is  alert, oriented to person and place, cooperative. The patient's memory is impaired.   PSYCHIATRIC: No significant confusion, agitation, or depression was noted.   LABORATORY, DIAGNOSTIC AND RADIOLOGICAL DATA:  BMP showed glucose of 149, BUN 22, otherwise BMP is unremarkable.  The patient's liver enzymes were normal.  Cardiac enzymes, first set negative.  White blood cell count is elevated to 11.1, hemoglobin 15.3, platelets 286. Coagulation panel is unremarkable.  Urinalysis is amber clear urine, negative for glucose, bilirubin or ketones, specific gravity 1.010, pH 6.0, negative for blood, protein, nitrites, or leukocyte esterase, less than 1 red blood cell, 1 white blood cell. No bacteria or epithelial cells were noted.  EKG done today on the 12/04/2011 showed sinus rhythm with premature atrial complexes at 78 beats per minute, left axis deviation, questionable septal infarct with QS in V2, no acute ST-T changes, however, were noted. Poor R wave progression in V2 seemed to be new since prior EKG done in May 2012.  Right hip complete x-ray 12/04/2011 showed nondisplaced comminuted proximal right femoral fracture.  Pelvis AP only on 12/04/2011 showed a right femoral fracture.  Chest portable single  view on 12/04/2011 revealed cardiomegaly.    ASSESSMENT AND PLAN:  1. Preoperative evaluation for patient who is planned to undergo hip surgery: The patient does have minor clinical predictors such as hypertension as well as old age. She does not have coronary artery disease, however, she does have coronary artery disease equivalent such as noninsulin-dependent diabetes mellitus, as well as hypertension and hyperlipidemia. Her EKG is relatively stable. I discussed the patient's  case with her daughter. She is agreeable for the patient to undergo operation despite her risks.  2. Cardiac murmur: Of unknown significance at this time. We will get echocardiogram after the operation.  3. Hypertension: We will  continue home medications such as Cardizem, as well as lisinopril, but we will be holding HCTZ because of mild dehydration. Her BUN is elevated as well as she has dry mucosa.  4. Elevated white blood cell count: Very likely related to stress. We will follow. No antibiotic therapy at this time except preoperative antibiotics according to Surgery.  5. History of hyperlipidemia: We will continue a low fat, low cholesterol diet. 6. History of diabetes mellitus: Continue sliding scale insulin while in the hospital.  7. History of hypothyroidism: Continue Synthroid, get  TSH.   TIME SPENT: 50 minutes.   ____________________________ Katharina Caper, MD rv:cbb D: 12/04/2011 14:31:34 ET T: 12/04/2011 16:03:47 ET JOB#: [id number]  cc: Katharina Caper, MD, <Dictator> Leo Grosser, MD Neville Walston Winona Legato MD ELECTRONICALLY SIGNED 12/05/2011 14:30

## 2014-12-26 NOTE — Consult Note (Signed)
Chief Complaint:   Subjective/Chief Complaint status post ORIF y'day and sitting in chair, daughter at bedside, very delightful and positive spirits, denies any symptoms. no bowel movement yet   VITAL SIGNS/ANCILLARY NOTES: **Vital Signs.:   04-Apr-13 10:26   Vital Signs Type Q 4hr   Temperature Temperature (F) 97.3   Celsius 36.2   Temperature Source oral   Pulse Pulse 86   Respirations Respirations 16   Systolic BP Systolic BP 118   Diastolic BP (mmHg) Diastolic BP (mmHg) 73   Mean BP 88   BP Source Dinamap   Pulse Ox % Pulse Ox % 95   Pulse Ox Activity Level  At rest   Oxygen Delivery Room Air/ 21 %   Brief Assessment:   Cardiac Regular  murmur present    Respiratory normal resp effort  clear BS    Gastrointestinal Normal   Assessment/Plan:  Assessment/Plan:   Assessment 1. High Blood Pressure (Hypertension): stable home meds, holding HCTZ though because of mild dehydration clinnically 3. HL, low fat diet 4. DIABETES MELLITUS: sugars controlled, SSI , Hgb a1c 7.6 5. leukocytosis, likely stress related, following 6. cardiac murmur, unknown significance, echocardiogram showing normal lv function, valvular regurg, family is aware of risks, cardizem for rate control 7. hip fracture: ORIF y'day, furthur management per ortho, therapy following, will need STR  * will stop nitrofurnatoin as her urine c/s neg (not sure whether it's used for any other purpose), no Urinary Tract Infection  * added stool softner for bowel regimen as no bowel movement yet * discontinue foley soon if she can get up anf go to bathroom to prevent getting Urinary Tract Infection    Plan time spent: 15 mins   Electronic Signatures: Patricia PesaShah, Saagar Tortorella S (MD)  (Signed 04-Apr-13 11:16)  Authored: Chief Complaint, VITAL SIGNS/ANCILLARY NOTES, Brief Assessment, Assessment/Plan   Last Updated: 04-Apr-13 11:16 by Patricia PesaShah, Iline Buchinger S (MD)

## 2014-12-26 NOTE — Discharge Summary (Signed)
PATIENT NAME:  Tina Munoz, [name] MR#:  [id number] DATE OF BIRTH:  07/30/1918  DATE OF ADMISSION:  12/04/2011 DATE OF DISCHARGE:  12/08/2011  DISCHARGE DIAGNOSES:  1. Comminuted displaced right intertrochanteric hip fracture.  2. Hypertension.  3. Early dementia.  4. Hyperlipidemia.  5. Hypothyroidism.  6. History of recurrent urinary tract infections.  7. Anemia.  8. Non-insulin-dependent diabetes mellitus.   OPERATIONS/PROCEDURES PERFORMED: Open reduction internal fixation right intertrochanteric hip fracture on 12/05/2011.   HISTORY OF PRESENT ILLNESS: History of present illness is as written on admission.   LABORATORY DATA: Laboratory data is as noted in the chart.   HOSPITAL COURSE: The patient was admitted and seen by [facility] and cleared for surgery. On 12/05/2011, she was taken to the Operating Room where open reduction internal fixation of her right intertrochanteric hip fracture was performed without difficulty. She tolerated the procedure quite well. Postoperatively, her mental status remained stable. She was advanced up into the chair on postoperative day one, which she tolerated relatively well. Her bandage was changed and is seen to be healing in quite well. She was advanced to regular food and did well with that.  I have written physical therapy orders for her be partial weight-bearing and for walking for short distances only 15 feet or less with the walker. Please remove her staples in 10 days if her wound is seen to be healing in quite well. She is to return to see Dr. Katrinka BlazingSmith in the office in three weeks for examination and x-ray.  ____________________________ Clare Gandyhristopher E. Aqua Denslow, MD ces:cbb D: 12/07/2011 15:16:33 ET T: 12/07/2011 15:32:58 ET JOB#: [id number] Clare GandyHRISTOPHER E Cristianna Cyr MD ELECTRONICALLY SIGNED 12/12/2011 17:08

## 2014-12-26 NOTE — Op Note (Signed)
PATIENT NAME:  Tina Munoz, Tina Munoz MR#:  [id number] DATE OF BIRTH:  03/31/1918  DATE OF PROCEDURE:  12/05/2011  PREOPERATIVE DIAGNOSIS: Displaced intertrochanteric right hip fracture.   POSTOPERATIVE DIAGNOSIS: Displaced intertrochanteric right hip fracture.   PROCEDURE: Open reduction, internal fixation right intertrochanteric hip fracture with compression screw.   SURGEON:  Myra Rudehristopher Cori Justus, M.D.   ANESTHESIA: Spinal.   COMPLICATIONS: None.   ESTIMATED BLOOD LOSS: 250 mL.   PROCEDURE:  Spinal anesthesia was induced and the patient was placed on the fracture table and secured in the usual manner for right hip surgery. The fracture was placed in mild longitudinal traction, abduction, and internal rotation. The reduction was checked in AP and lateral views and there was seen to be mild to moderate displacement. The lateral right hip is thoroughly prepped with alcohol and ChloraPrep and draped in standard sterile fashion. A standard lateral incision is made and the dissection carried down to the lateral femoral cortex. Guidepin is inserted into the femoral head and neck and is seen to be satisfactory. The femoral head and neck is reamed and a 90-mm compression lag screw is inserted into the femoral head and neck and checked in AP and lateral views and is seen to be in satisfactory position. This is connected to a four-hole 130-degree sideplate and this is secured to the femoral shaft using the [place] claw clamp. The plate is secured to the femur using multiple 4.5 cortical screws. The final position of the fracture and of the internal fixation is checked in AP and lateral views and is seen to be satisfactory. The wound is thoroughly irrigated multiple times. Vastus lateralis is closed with 2-0 Vicryl. The fascia lata is closed with 0 Ethibond. Subcutaneous tissue is closed with 2-0 Vicryl and the skin is closed with the skin stapler. A soft bulky dressing is applied.     The patient is returned to  the hospital bed and to the recovery room in satisfactory condition having tolerated the procedure quite well.     ____________________________ Clare Gandyhristopher E. Kobe Jansma, MD ces:bjt D: 12/05/2011 17:22:58 ET T: 12/05/2011 17:56:06 ET JOB#: [id number]  cc: Clare Gandyhristopher E. Tamyka Bezio, MD, <Dictator> Clare GandyHRISTOPHER E Roselene Gray MD ELECTRONICALLY SIGNED 12/06/2011 13:37

## 2014-12-26 NOTE — Consult Note (Signed)
Brief Consult Note: Diagnosis: preoperative evaluation, R hip fx,HTN, hyperliidemia, DM, leukocytosuis, cardiac murmur.   Patient was seen by consultant.   Consult note dictated.   Recommend to proceed with surgery or procedure.   Orders entered.   Discussed with Attending MD.   Comments: 1. preoperative evaluation for pT who is going to OR for hip fx,  Pt has medium clinical predictors for perivascular cardiovascular risks, such as HL, HTN, DM, but no known CAD, d/w daughter extensively, agreeble for Pt to undergo surgery despite risks 2. HTN, home meds, holding HCTZ though because of mild dehydration clinnically 3. HL, low fat diet 4. DM, SSI, Hgb a1c 5. leukocytosis, likely stresss, following 6. cardiac murmur, unknown significance, getting echo, family is aware of risks, cardizem for rate control Thank you for consult, we'll follow.  Electronic Signatures: Katharina CaperVaickute, Glory Graefe (MD)  (Signed 02-Apr-13 14:39)  Authored: Brief Consult Note   Last Updated: 02-Apr-13 14:39 by Katharina CaperVaickute, Generoso Cropper (MD)
